# Patient Record
Sex: Male | Born: 1950 | Race: White | Hispanic: No | Marital: Married | Smoking: Former smoker
Health system: Southern US, Community
[De-identification: ages and names within clinical notes are randomized; demographics above are authoritative.]

## PROBLEM LIST (undated history)

## (undated) DIAGNOSIS — I10 Essential (primary) hypertension: Secondary | ICD-10-CM

## (undated) DIAGNOSIS — E079 Disorder of thyroid, unspecified: Secondary | ICD-10-CM

## (undated) DIAGNOSIS — M1990 Unspecified osteoarthritis, unspecified site: Secondary | ICD-10-CM

## (undated) DIAGNOSIS — E039 Hypothyroidism, unspecified: Secondary | ICD-10-CM

## (undated) DIAGNOSIS — N183 Chronic kidney disease, stage 3 unspecified: Secondary | ICD-10-CM

## (undated) DIAGNOSIS — M109 Gout, unspecified: Secondary | ICD-10-CM

## (undated) DIAGNOSIS — K5190 Ulcerative colitis, unspecified, without complications: Secondary | ICD-10-CM

## (undated) HISTORY — PX: FRACTURE SURGERY: SHX138

## (undated) HISTORY — PX: KNEE CARTILAGE SURGERY: SHX688

## (undated) HISTORY — PX: TIBIA HARDWARE REMOVAL: SUR1133

---

## 1986-04-10 HISTORY — PX: TIBIA FRACTURE SURGERY: SHX806

## 2003-04-11 HISTORY — PX: COLOSTOMY REVERSAL: SHX5782

## 2003-04-11 HISTORY — PX: COLECTOMY: SHX59

## 2006-08-12 ENCOUNTER — Other Ambulatory Visit: Payer: Self-pay

## 2006-08-12 ENCOUNTER — Inpatient Hospital Stay: Payer: Self-pay | Admitting: Internal Medicine

## 2010-03-24 ENCOUNTER — Inpatient Hospital Stay: Payer: Self-pay | Admitting: General Surgery

## 2012-06-05 ENCOUNTER — Emergency Department: Payer: Self-pay | Admitting: Unknown Physician Specialty

## 2012-06-05 LAB — URINALYSIS, COMPLETE
Blood: NEGATIVE
Glucose,UR: NEGATIVE mg/dL (ref 0–75)
Hyaline Cast: 1
Leukocyte Esterase: NEGATIVE
Nitrite: NEGATIVE
Ph: 5 (ref 4.5–8.0)
Protein: NEGATIVE
Specific Gravity: 1.027 (ref 1.003–1.030)
WBC UR: 1 /HPF (ref 0–5)

## 2012-06-05 LAB — TSH: Thyroid Stimulating Horm: 3.43 u[IU]/mL

## 2012-06-05 LAB — CBC
HGB: 13 g/dL (ref 13.0–18.0)
RBC: 4.1 10*6/uL — ABNORMAL LOW (ref 4.40–5.90)
RDW: 13.6 % (ref 11.5–14.5)
WBC: 10.6 10*3/uL (ref 3.8–10.6)

## 2012-06-05 LAB — COMPREHENSIVE METABOLIC PANEL
Albumin: 3.2 g/dL — ABNORMAL LOW (ref 3.4–5.0)
Alkaline Phosphatase: 90 U/L (ref 50–136)
BUN: 18 mg/dL (ref 7–18)
Bilirubin,Total: 0.6 mg/dL (ref 0.2–1.0)
Chloride: 104 mmol/L (ref 98–107)
Co2: 25 mmol/L (ref 21–32)
Creatinine: 1.16 mg/dL (ref 0.60–1.30)
EGFR (Non-African Amer.): >60
Glucose: 116 mg/dL — ABNORMAL HIGH (ref 65–99)
Potassium: 4.1 mmol/L (ref 3.5–5.1)
SGOT(AST): 25 U/L (ref 15–37)
SGPT (ALT): 23 U/L (ref 12–78)
Sodium: 135 mmol/L — ABNORMAL LOW (ref 136–145)
Total Protein: 7.9 g/dL (ref 6.4–8.2)

## 2012-06-05 LAB — CK TOTAL AND CKMB (NOT AT ARMC): CK, Total: 324 U/L — ABNORMAL HIGH (ref 35–232)

## 2012-06-05 LAB — TROPONIN I: Troponin-I: <0.02 ng/mL

## 2013-08-12 ENCOUNTER — Ambulatory Visit: Payer: Self-pay | Admitting: Emergency Medicine

## 2013-08-12 LAB — DOT URINE DIP
Glucose,UR: NEGATIVE mg/dL (ref 0–75)
PROTEIN: NEGATIVE
Specific Gravity: 1.02 (ref 1.003–1.030)

## 2013-11-28 DIAGNOSIS — I10 Essential (primary) hypertension: Secondary | ICD-10-CM | POA: Diagnosis not present

## 2015-10-31 ENCOUNTER — Encounter: Payer: Self-pay | Admitting: Emergency Medicine

## 2015-10-31 ENCOUNTER — Emergency Department
Admission: EM | Admit: 2015-10-31 | Discharge: 2015-10-31 | Disposition: A | Discharge status: Home / Self Care | Payer: Medicare Other | Attending: Emergency Medicine | Admitting: Emergency Medicine

## 2015-10-31 DIAGNOSIS — I10 Essential (primary) hypertension: Secondary | ICD-10-CM | POA: Diagnosis not present

## 2015-10-31 DIAGNOSIS — M109 Gout, unspecified: Secondary | ICD-10-CM | POA: Insufficient documentation

## 2015-10-31 DIAGNOSIS — Z87891 Personal history of nicotine dependence: Secondary | ICD-10-CM | POA: Diagnosis not present

## 2015-10-31 DIAGNOSIS — M7989 Other specified soft tissue disorders: Secondary | ICD-10-CM | POA: Diagnosis present

## 2015-10-31 HISTORY — DX: Essential (primary) hypertension: I10

## 2015-10-31 HISTORY — DX: Gout, unspecified: M10.9

## 2015-10-31 HISTORY — DX: Disorder of thyroid, unspecified: E07.9

## 2015-10-31 MED ORDER — PREDNISONE 20 MG PO TABS
ORAL_TABLET | ORAL | Status: AC
  Administered 2015-10-31: 60 mg via ORAL
  Filled 2015-10-31: qty 3

## 2015-10-31 MED ORDER — COLCHICINE 0.6 MG PO TABS: 0.6000 mg | ORAL_TABLET | ORAL | 0 refills | Status: AC | PRN

## 2015-10-31 MED ORDER — ALLOPURINOL 300 MG PO TABS: 300.0000 mg | ORAL_TABLET | Freq: Every day | ORAL | 0 refills | Status: DC

## 2015-10-31 MED ORDER — COLCHICINE 0.6 MG PO TABS
ORAL_TABLET | ORAL | Status: AC
  Administered 2015-10-31: 1.2 mg via ORAL
  Filled 2015-10-31: qty 2

## 2015-10-31 MED ORDER — COLCHICINE 0.6 MG PO TABS
1.2000 mg | ORAL_TABLET | Freq: Once | ORAL | Status: AC
  Administered 2015-10-31: 1.2 mg via ORAL

## 2015-10-31 MED ORDER — KETOROLAC TROMETHAMINE 10 MG PO TABS
ORAL_TABLET | ORAL | Status: AC
  Administered 2015-10-31: 10 mg via ORAL
  Filled 2015-10-31: qty 1

## 2015-10-31 MED ORDER — PREDNISONE 20 MG PO TABS
60.0000 mg | ORAL_TABLET | Freq: Once | ORAL | Status: AC
  Administered 2015-10-31: 60 mg via ORAL

## 2015-10-31 MED ORDER — KETOROLAC TROMETHAMINE 10 MG PO TABS
10.0000 mg | ORAL_TABLET | Freq: Once | ORAL | Status: AC
  Administered 2015-10-31: 10 mg via ORAL

## 2015-10-31 NOTE — Discharge Instructions (Signed)

## 2015-10-31 NOTE — ED Notes (Signed)
See triage assessment.  Pt alert and oriented in NAD.

## 2015-10-31 NOTE — ED Provider Notes (Signed)
[facility] Emergency Department Provider Note  ____________________________________________  Time seen: Approximately 7:18 AM  I have reviewed the triage vital signs and the nursing notes.   HISTORY  Chief Complaint Gout    HPI Jeremy Atkinson is a 65 y.o. male , NAD, presents to the emergency department with acute one set gout flare. States he had pain, redness, swelling started in the right great toe overnight last night. Has been on allopurinol 300 mg once daily for some time but ran out 3 days ago. Also ran out of his culture seen in which she normally takes for acute flares. Patient is currently changing family physicians from the [facility] [place] [facility] but does not have an appointment to establish until August. Denies any injury, trauma or falls calls the pain in his toe. Has not had any chest pain, shortness of breath, redness or warmth to the lower extremity. Has been able to ambulate but states increased pain in the right great toe. No open wounds or lesions and has not noted any oozing or weeping.   Past Medical History:  Diagnosis Date  . Gout   . Hypertension   . Thyroid disease     There are no active problems to display for this patient.   Past Surgical History:  Procedure Laterality Date  . COLON SURGERY    . COLOSTOMY REVERSAL    . KNEE SURGERY        Allergies Percocet [oxycodone-acetaminophen]  No family history on file.  Social History Social History  Substance Use Topics  . Smoking status: Former Research scientist (life sciences)  . Smokeless tobacco: Never Used  . Alcohol use Not on file     Review of Systems  Constitutional: No fever/chills, fatigue Eyes: No visual changes. Cardiovascular: No chest pain. Respiratory:  No shortness of breath.  Gastrointestinal: No abdominal pain.  No nausea, vomiting.   Musculoskeletal: Positive right great toe pain. No foot, ankle, knee pain. Skin: Positive redness and swelling about the distal  joint of right great toe. Negative for rash, skin sores, open wounds, bruising. Neurological: Negative for headaches, focal weakness or numbness. No tingling 10-point ROS otherwise negative.  ____________________________________________   PHYSICAL EXAM:  VITAL SIGNS: ED Triage Vitals  Enc Vitals Group     BP 10/31/15 0507 118/72     Pulse Rate 10/31/15 0507 73     Resp 10/31/15 0507 18     Temp 10/31/15 0507 98.2 F (36.8 C)     Temp Source 10/31/15 0507 Oral     SpO2 10/31/15 0507 93 %     Weight 10/31/15 0507 200 lb (90.7 kg)     Height 10/31/15 0507 6' (1.829 m)     Head Circumference --      Peak Flow --      Pain Score 10/31/15 0508 8     Pain Loc --      Pain Edu? --      Excl. in [place]? --      Constitutional: Alert and oriented. Well appearing and in no acute distress. Eyes: Conjunctivae are normal. Head: Atraumatic. Neck: Supple with full range of motion Hematological/Lymphatic/Immunilogical: No cervical lymphadenopathy. Cardiovascular: Normal rate, regular rhythm. Normal S1 and S2.  Good peripheral circulation with 2+ pulses noted in the right lower extremity. Capillary refill is brisk in all digits of the right foot. Respiratory: Normal respiratory effort without tachypnea or retractions. Lungs CTAB with breath sounds noted in all lung fields. No wheeze, rhonchi, rales.  Musculoskeletal: Full range of motion of the right ankle, foot, toes. Tenderness to palpation and manipulation of the right first DIP. No crepitus or bony abnormality noted to palpation. No lower extremity tenderness nor edema.  No joint effusions. Neurologic:  Normal speech and language. No gross focal neurologic deficits are appreciated.  Skin:  Skin about the right first DIP of the foot is diffusely erythematous, swollen and warm. No open wounds or lesions. No oozing or weeping. Skin is warm, dry and intact. No rash noted. Psychiatric: Mood and affect are normal. Speech and behavior are normal.  Patient exhibits appropriate insight and judgement.   ____________________________________________   LABS  None ____________________________________________  EKG  None ____________________________________________  RADIOLOGY  None ____________________________________________    PROCEDURES  Procedure(s) performed: None    Medications  colchicine tablet 1.2 mg (1.2 mg Oral Given 10/31/15 0516)  predniSONE (DELTASONE) tablet 60 mg (60 mg Oral Given 10/31/15 0516)  ketorolac (TORADOL) tablet 10 mg (10 mg Oral Given 10/31/15 0516)    Patient notes improvement of pain and swelling about the right great toe since being given medications per above. ____________________________________________   INITIAL IMPRESSION / ASSESSMENT AND PLAN / ED COURSE  Pertinent labs & imaging results that were available during my care of the patient were reviewed by me and considered in my medical decision making (see chart for details).  Patient's diagnosis is consistent with acute gout of the right foot. Patient will be discharged home with prescriptions for allopurinol and colchicine to take as directed. Patient is to follow up with [facility] if symptoms persist past this treatment course. Patient is given ED precautions to return to the ED for any worsening or new symptoms.     ____________________________________________  FINAL CLINICAL IMPRESSION(S) / ED DIAGNOSES  Final diagnoses:  Acute gout of right foot, unspecified cause      NEW MEDICATIONS STARTED DURING THIS VISIT:  Discharge Medication List as of 10/31/2015  7:38 AM    START taking these medications   Details  allopurinol (ZYLOPRIM) 300 MG tablet Take 1 tablet (300 mg total) by mouth daily., Starting Sun 10/31/2015, Until Wed 12/01/2015, Print    colchicine 0.6 MG tablet Take 1 tablet (0.6 mg total) by mouth as needed. Take 2 tablets at onset of pain, then may take 1 more 1 hour later if pain continues.,  Starting Sun 10/31/2015, Print             Braxton Feathers, PA-C 10/31/15 [place], MD 10/31/15 [phone]

## 2015-10-31 NOTE — ED Triage Notes (Signed)
Patient states that he has a history of gout and that he started having pain to right first toe that developed last night. Patient with redness and swelling to his toe. Patient reports that he is switching mds and his appointment is not until august so he is unable to get a prescription.

## 2015-10-31 NOTE — ED Notes (Signed)
This RN to speak with provider while patient still being triaged. Jeremy Client, MD consulted. MD made aware of presenting complaints and triage assessment. MD with [facility] for: Prednisone 60mg  PO, Colcrys 1.2mg  PO, and Toradol 10mg  PO. Orders to be entered and carried by this RN.

## 2016-06-02 ENCOUNTER — Emergency Department: Payer: Medicare Other

## 2016-06-02 ENCOUNTER — Encounter (HOSPITAL_COMMUNITY): Payer: Self-pay | Admitting: General Practice

## 2016-06-02 ENCOUNTER — Observation Stay (HOSPITAL_COMMUNITY)
Admission: AD | Admit: 2016-06-02 | Discharge: 2016-06-03 | Disposition: A | Discharge status: Home / Self Care | Payer: No Typology Code available for payment source | Source: Other Acute Inpatient Hospital | Attending: General Surgery | Admitting: General Surgery

## 2016-06-02 ENCOUNTER — Emergency Department
Admission: EM | Admit: 2016-06-02 | Discharge: 2016-06-02 | Disposition: A | Discharge status: Other Acute Inpatient Hospital | Payer: Medicare Other | Attending: Emergency Medicine | Admitting: Emergency Medicine

## 2016-06-02 DIAGNOSIS — Y999 Unspecified external cause status: Secondary | ICD-10-CM | POA: Insufficient documentation

## 2016-06-02 DIAGNOSIS — I10 Essential (primary) hypertension: Secondary | ICD-10-CM | POA: Diagnosis not present

## 2016-06-02 DIAGNOSIS — S2239XA Fracture of one rib, unspecified side, initial encounter for closed fracture: Secondary | ICD-10-CM

## 2016-06-02 DIAGNOSIS — S2242XA Multiple fractures of ribs, left side, initial encounter for closed fracture: Secondary | ICD-10-CM | POA: Insufficient documentation

## 2016-06-02 DIAGNOSIS — Y9389 Activity, other specified: Secondary | ICD-10-CM | POA: Insufficient documentation

## 2016-06-02 DIAGNOSIS — S270XXA Traumatic pneumothorax, initial encounter: Secondary | ICD-10-CM

## 2016-06-02 DIAGNOSIS — S51012A Laceration without foreign body of left elbow, initial encounter: Secondary | ICD-10-CM | POA: Diagnosis not present

## 2016-06-02 DIAGNOSIS — Z9049 Acquired absence of other specified parts of digestive tract: Secondary | ICD-10-CM | POA: Diagnosis not present

## 2016-06-02 DIAGNOSIS — Y92410 Unspecified street and highway as the place of occurrence of the external cause: Secondary | ICD-10-CM | POA: Diagnosis not present

## 2016-06-02 DIAGNOSIS — M109 Gout, unspecified: Secondary | ICD-10-CM | POA: Diagnosis not present

## 2016-06-02 DIAGNOSIS — K5190 Ulcerative colitis, unspecified, without complications: Secondary | ICD-10-CM | POA: Insufficient documentation

## 2016-06-02 DIAGNOSIS — E039 Hypothyroidism, unspecified: Secondary | ICD-10-CM | POA: Insufficient documentation

## 2016-06-02 DIAGNOSIS — Z885 Allergy status to narcotic agent status: Secondary | ICD-10-CM | POA: Insufficient documentation

## 2016-06-02 DIAGNOSIS — Z79899 Other long term (current) drug therapy: Secondary | ICD-10-CM | POA: Diagnosis not present

## 2016-06-02 DIAGNOSIS — Z87891 Personal history of nicotine dependence: Secondary | ICD-10-CM | POA: Insufficient documentation

## 2016-06-02 DIAGNOSIS — S0990XA Unspecified injury of head, initial encounter: Secondary | ICD-10-CM | POA: Diagnosis present

## 2016-06-02 DIAGNOSIS — S2249XA Multiple fractures of ribs, unspecified side, initial encounter for closed fracture: Secondary | ICD-10-CM

## 2016-06-02 HISTORY — DX: Hypothyroidism, unspecified: E03.9

## 2016-06-02 HISTORY — DX: Unspecified osteoarthritis, unspecified site: M19.90

## 2016-06-02 HISTORY — DX: Ulcerative colitis, unspecified, without complications: K51.90

## 2016-06-02 MED ORDER — ACETAMINOPHEN 325 MG PO TABS: 650.0000 mg | ORAL_TABLET | ORAL | Status: DC | PRN

## 2016-06-02 MED ORDER — TRAMADOL HCL 50 MG PO TABS
50.0000 mg | ORAL_TABLET | Freq: Four times a day (QID) | ORAL | Status: DC | PRN
  Administered 2016-06-02 – 2016-06-03 (×2): 50 mg via ORAL
  Filled 2016-06-02 (×2): qty 1

## 2016-06-02 MED ORDER — LOPERAMIDE HCL 2 MG PO CAPS: 2.0000 mg | ORAL_CAPSULE | Freq: Three times a day (TID) | ORAL | Status: DC | PRN

## 2016-06-02 MED ORDER — MORPHINE SULFATE (PF) 4 MG/ML IV SOLN
4.0000 mg | Freq: Once | INTRAVENOUS | Status: AC
  Administered 2016-06-02: 4 mg via INTRAVENOUS
  Filled 2016-06-02: qty 1

## 2016-06-02 MED ORDER — SODIUM CHLORIDE 0.9% FLUSH: 3.0000 mL | INTRAVENOUS | Status: DC | PRN

## 2016-06-02 MED ORDER — IBUPROFEN 200 MG PO TABS
200.0000 mg | ORAL_TABLET | ORAL | Status: DC | PRN
  Administered 2016-06-02 – 2016-06-03 (×2): 200 mg via ORAL
  Filled 2016-06-02 (×2): qty 1

## 2016-06-02 MED ORDER — ONDANSETRON HCL 4 MG/2ML IJ SOLN
4.0000 mg | Freq: Once | INTRAMUSCULAR | Status: AC
  Administered 2016-06-02: 4 mg via INTRAVENOUS
  Filled 2016-06-02: qty 2

## 2016-06-02 MED ORDER — HYDROMORPHONE HCL 2 MG/ML IJ SOLN
0.5000 mg | INTRAMUSCULAR | Status: DC | PRN
  Administered 2016-06-02 – 2016-06-03 (×2): 0.5 mg via INTRAVENOUS
  Filled 2016-06-02 (×2): qty 1

## 2016-06-02 MED ORDER — HYDRALAZINE HCL 20 MG/ML IJ SOLN
5.0000 mg | Freq: Four times a day (QID) | INTRAMUSCULAR | Status: DC | PRN
Start: 2016-06-02 — End: 2016-06-03

## 2016-06-02 MED ORDER — SODIUM CHLORIDE 0.9% FLUSH
3.0000 mL | Freq: Two times a day (BID) | INTRAVENOUS | Status: DC
  Administered 2016-06-02 (×2): 3 mL via INTRAVENOUS

## 2016-06-02 MED ORDER — ENOXAPARIN SODIUM 40 MG/0.4ML ~~LOC~~ SOLN
40.0000 mg | SUBCUTANEOUS | Status: DC
  Administered 2016-06-02: 40 mg via SUBCUTANEOUS
  Filled 2016-06-02: qty 0.4

## 2016-06-02 MED ORDER — ONDANSETRON HCL 4 MG/2ML IJ SOLN: 4.0000 mg | Freq: Four times a day (QID) | INTRAMUSCULAR | Status: DC | PRN

## 2016-06-02 MED ORDER — HYDROMORPHONE HCL 1 MG/ML IJ SOLN
1.0000 mg | Freq: Once | INTRAMUSCULAR | Status: AC
  Administered 2016-06-02: 1 mg via INTRAVENOUS
  Filled 2016-06-02: qty 1

## 2016-06-02 MED ORDER — SODIUM CHLORIDE 0.9 % IV SOLN: 250.0000 mL | INTRAVENOUS | Status: DC | PRN

## 2016-06-02 MED ORDER — ACETAMINOPHEN 325 MG PO TABS
650.0000 mg | ORAL_TABLET | Freq: Once | ORAL | Status: AC
  Administered 2016-06-02: 650 mg via ORAL
  Filled 2016-06-02: qty 2

## 2016-06-02 MED ORDER — ACETAMINOPHEN 325 MG PO TABS
650.0000 mg | ORAL_TABLET | Freq: Four times a day (QID) | ORAL | Status: DC | PRN
  Administered 2016-06-02: 650 mg via ORAL
  Filled 2016-06-02: qty 2

## 2016-06-02 MED ORDER — BACITRACIN ZINC 500 UNIT/GM EX OINT
1.0000 "application " | TOPICAL_OINTMENT | Freq: Two times a day (BID) | CUTANEOUS | Status: DC
  Administered 2016-06-02: 1 via TOPICAL
  Filled 2016-06-02: qty 0.9

## 2016-06-02 MED ORDER — BUTALBITAL-APAP-CAFFEINE 50-325-40 MG PO TABS
2.0000 | ORAL_TABLET | Freq: Once | ORAL | Status: AC
  Administered 2016-06-02: 2 via ORAL
  Filled 2016-06-02: qty 2

## 2016-06-02 MED ORDER — ONDANSETRON HCL 4 MG PO TABS
4.0000 mg | ORAL_TABLET | Freq: Four times a day (QID) | ORAL | Status: DC | PRN
Start: 2016-06-02 — End: 2016-06-03
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
## 2016-06-02 NOTE — ED Provider Notes (Signed)
ED ECG REPORT I, Daymon Larsen, the attending physician, personally viewed and interpreted this ECG.  Date: 06/02/2016 EKG Time: *0931 Rate: *86 Rhythm: normal sinus rhythm QRS Axis: normal Intervals: normal ST/T Wave abnormalities: normal Conduction Disturbances: none Narrative Interpretation: unremarkable Normal EKG   Daymon Larsen, MD 06/02/16 1209

## 2016-06-02 NOTE — ED Notes (Addendum)
[facility] has pt on stretcher and is leaving ED at this time. Report called to Patty, RN at this time.

## 2016-06-02 NOTE — Progress Notes (Signed)
Pt arrived to 6n3 by [facility] from [facility]. Oriented to room and surroundings, denies nausea, c/o left rib pain 5/10. Trauma notified of arrrival
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
## 2016-06-02 NOTE — Discharge Instructions (Signed)
Rib Fracture A rib fracture is a break or crack in one of the bones of the ribs. The ribs are like a cage that goes around your upper chest. A broken or cracked rib is often painful, but most do not cause other problems. Most rib fractures heal on their own in 1-3 months. Follow these instructions at home:  Avoid activities that cause pain to the injured area. Protect your injured area.  Slowly increase activity as told by your doctor.  Take medicine as told by your doctor.  Put ice on the injured area for the first 1-2 days after you have been treated or as told by your doctor.  Put ice in a plastic bag.  Place a towel between your skin and the bag.  Leave the ice on for 15-20 minutes at a time, every 2 hours while you are awake.  Do deep breathing as told by your doctor. You may be told to:  Take deep breaths many times a day.  Cough many times a day while hugging a pillow.  Use a device (incentive spirometer) to perform deep breathing many times a day.  Drink enough fluids to keep your pee (urine) clear or pale yellow.  Do not wear a rib belt or binder. These do not allow you to breathe deeply. Get help right away if:  You have a fever.  You have trouble breathing.  You cannot stop coughing.  You cough up thick or bloody spit (mucus).  You feel sick to your stomach (nauseous), throw up (vomit), or have belly (abdominal) pain.  Your pain gets worse and medicine does not help. This information is not intended to replace advice given to you by your health care provider. Make sure you discuss any questions you have with your health care provider. Document Released: 01/04/2008 Document Revised: 09/02/2015 Document Reviewed: 05/29/2012 Elsevier Interactive Patient Education  2017 [employer]  

## 2016-06-03 ENCOUNTER — Observation Stay (HOSPITAL_COMMUNITY): Payer: No Typology Code available for payment source

## 2016-06-03 DIAGNOSIS — S2242XA Multiple fractures of ribs, left side, initial encounter for closed fracture: Secondary | ICD-10-CM | POA: Diagnosis not present

## 2016-06-03 LAB — BASIC METABOLIC PANEL
ANION GAP: 6 (ref 5–15)
BUN: 22 mg/dL — AB (ref 6–20)
CALCIUM: 8.8 mg/dL — AB (ref 8.9–10.3)
CO2: 27 mmol/L (ref 22–32)
CREATININE: 1.36 mg/dL — AB (ref 0.61–1.24)
Chloride: 104 mmol/L (ref 101–111)
GFR calc Af Amer: >60 mL/min (ref >60)
GFR, EST NON AFRICAN AMERICAN: 52 mL/min — AB (ref >60)
GLUCOSE: 105 mg/dL — AB (ref 65–99)
Potassium: 4.2 mmol/L (ref 3.5–5.1)
Sodium: 137 mmol/L (ref 135–145)

## 2016-06-03 LAB — CBC
HCT: 36.4 % — ABNORMAL LOW (ref 39.0–52.0)
Hemoglobin: 12.2 g/dL — ABNORMAL LOW (ref 13.0–17.0)
MCH: 32.4 pg (ref 26.0–34.0)
MCHC: 33.5 g/dL (ref 30.0–36.0)
MCV: 96.6 fL (ref 78.0–100.0)
PLATELETS: 125 10*3/uL — AB (ref 150–400)
RBC: 3.77 MIL/uL — ABNORMAL LOW (ref 4.22–5.81)
RDW: 13.4 % (ref 11.5–15.5)
WBC: 4.8 10*3/uL (ref 4.0–10.5)

## 2016-06-03 MED ORDER — TRAMADOL HCL 50 MG PO TABS: 50.0000 mg | ORAL_TABLET | Freq: Four times a day (QID) | ORAL | 0 refills | Status: DC | PRN
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
## 2016-06-03 NOTE — Progress Notes (Signed)
Pt verbally understood DC instructions, no questions asked 

## 2016-06-03 NOTE — Progress Notes (Signed)
Patient doing okay.  CXR clear.  Can go home.  Kathryne Eriksson. Dahlia Bailiff, MD, [place] [phone] Trauma Surgeon
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
## 2017-06-06 ENCOUNTER — Other Ambulatory Visit: Payer: Self-pay | Admitting: Internal Medicine

## 2017-06-06 DIAGNOSIS — N183 Chronic kidney disease, stage 3 unspecified: Secondary | ICD-10-CM

## 2017-06-13 ENCOUNTER — Ambulatory Visit
Admission: RE | Admit: 2017-06-13 | Discharge: 2017-06-13 | Disposition: A | Discharge status: Home / Self Care | Payer: Medicare Other | Source: Ambulatory Visit | Attending: Internal Medicine | Admitting: Internal Medicine

## 2017-06-13 DIAGNOSIS — N183 Chronic kidney disease, stage 3 unspecified: Secondary | ICD-10-CM

## 2017-06-13 DIAGNOSIS — E039 Hypothyroidism, unspecified: Secondary | ICD-10-CM | POA: Insufficient documentation
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
## 2018-06-13 IMAGING — US US RENAL
1 series · 14 of 25 positions shown · non-contrast
Comparison: CT abdomen pelvis of 03/23/2010

CLINICAL DATA: Chronic kidney disease, stage III

EXAM:
RENAL / URINARY TRACT ULTRASOUND COMPLETE

[Series 1: us renal · 0.26mm/px · 14 of 33 slices shown]
[im 1/33]
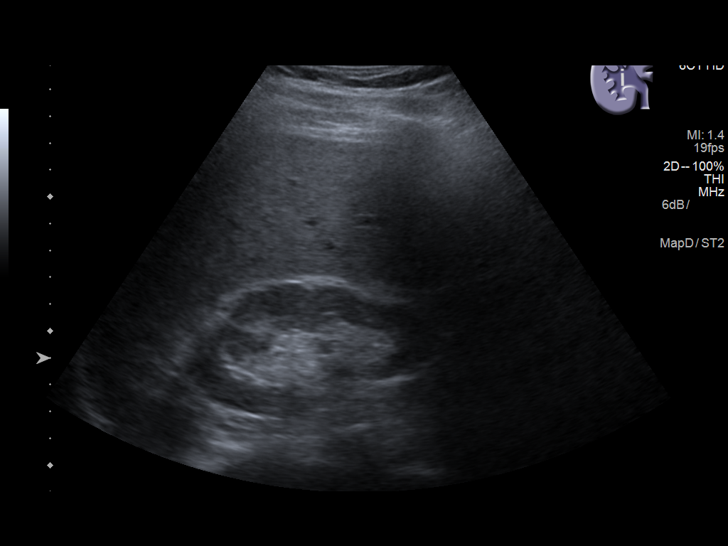
[im 3/33]
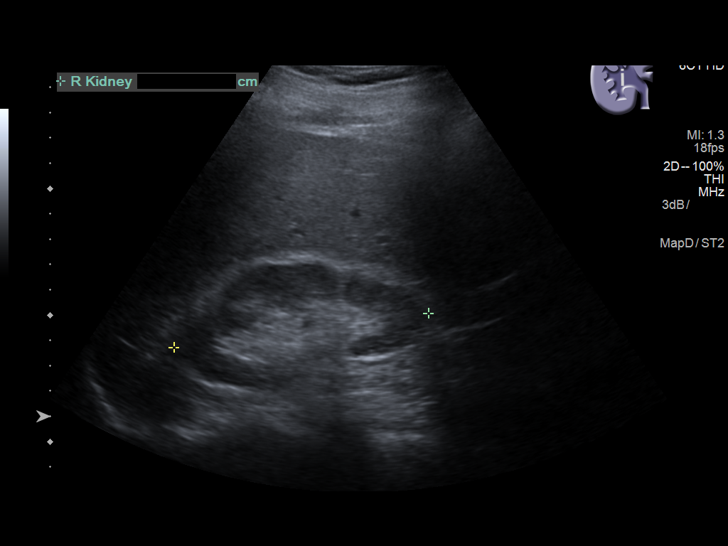
[im 6/33]
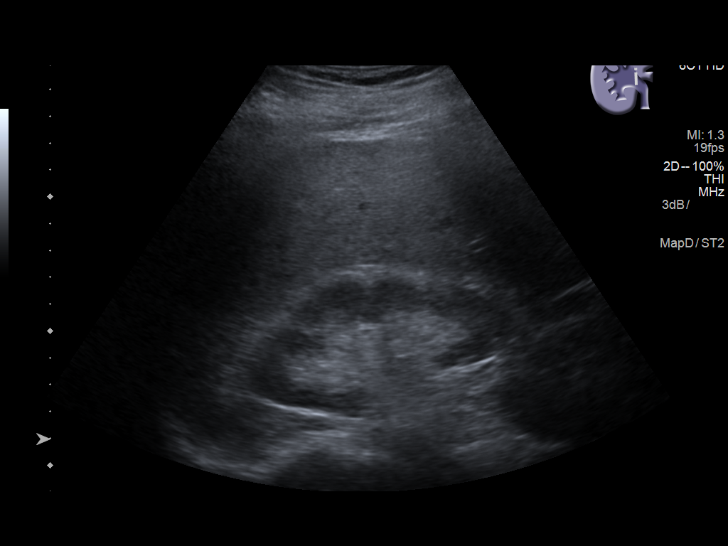
[im 9/33]
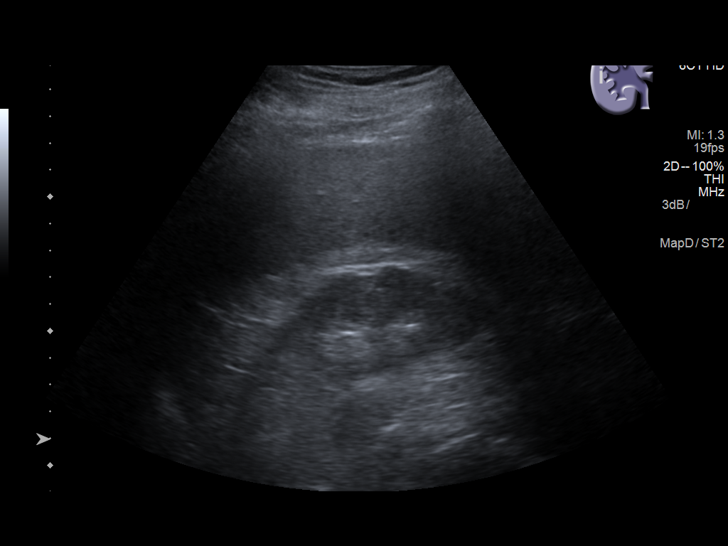
[im 11/33]
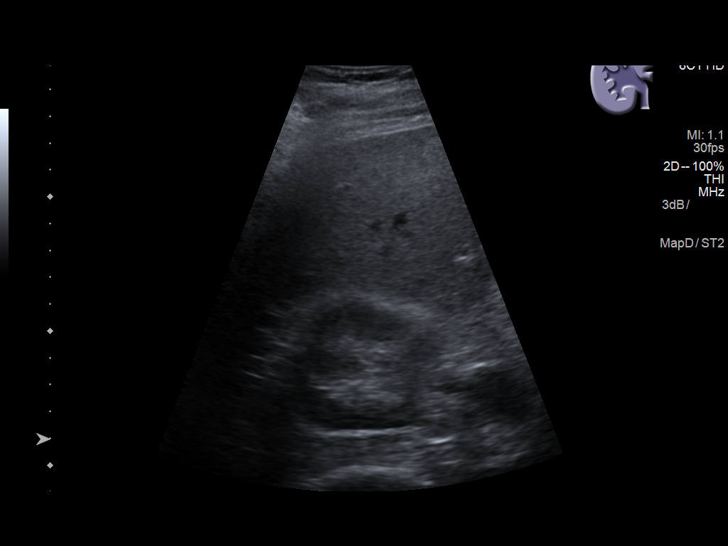
[im 13/33]
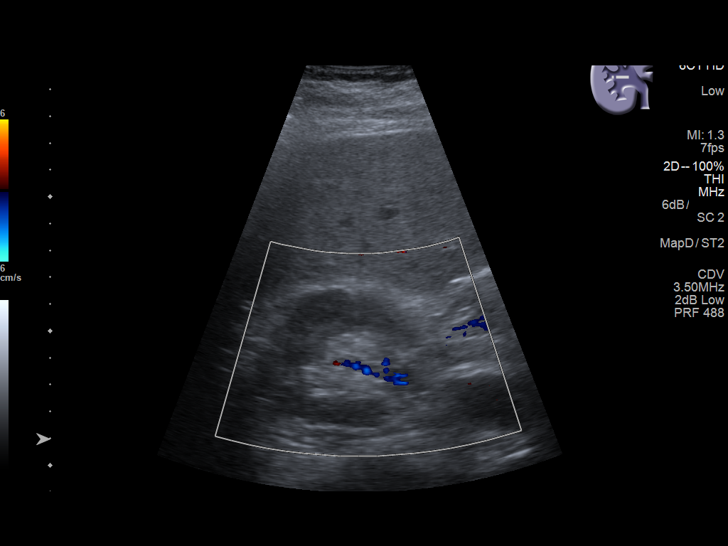
[im 15/33]
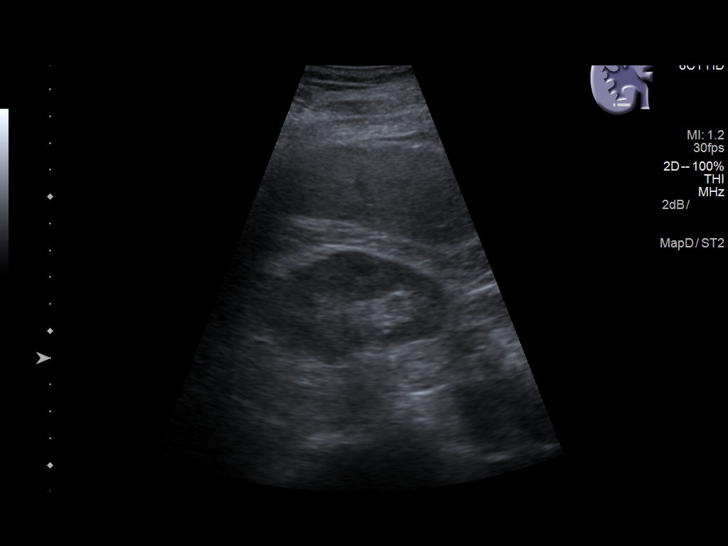
[im 18/33]
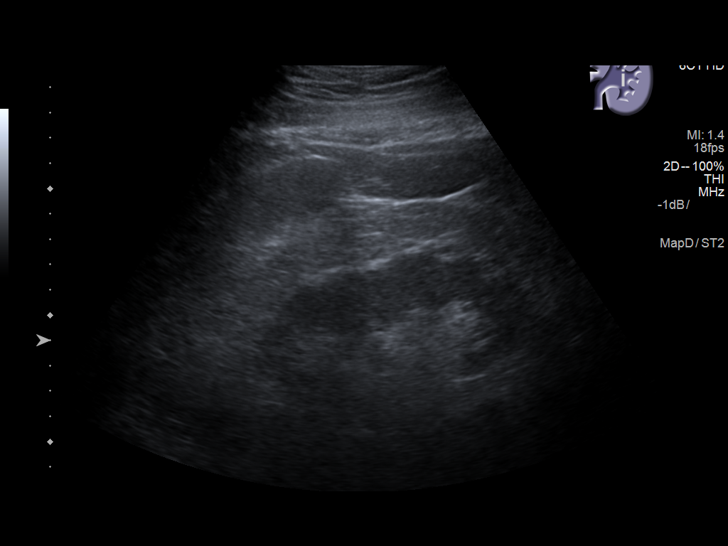
[im 21/33]
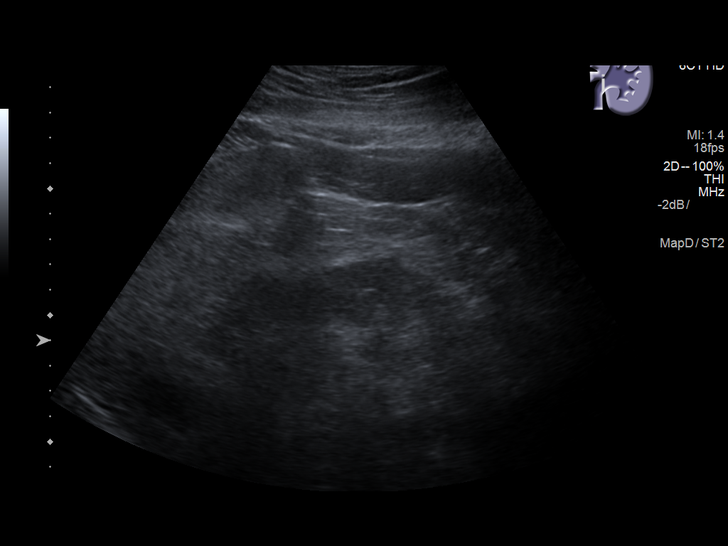
[im 22/33]
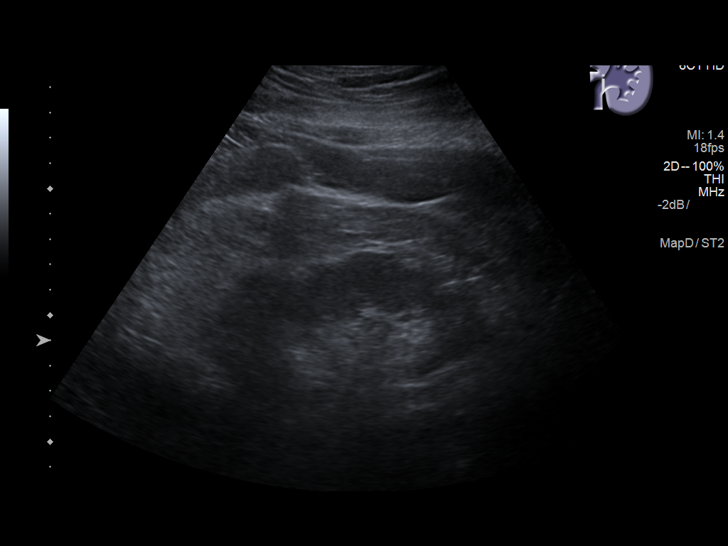
[im 25/33]
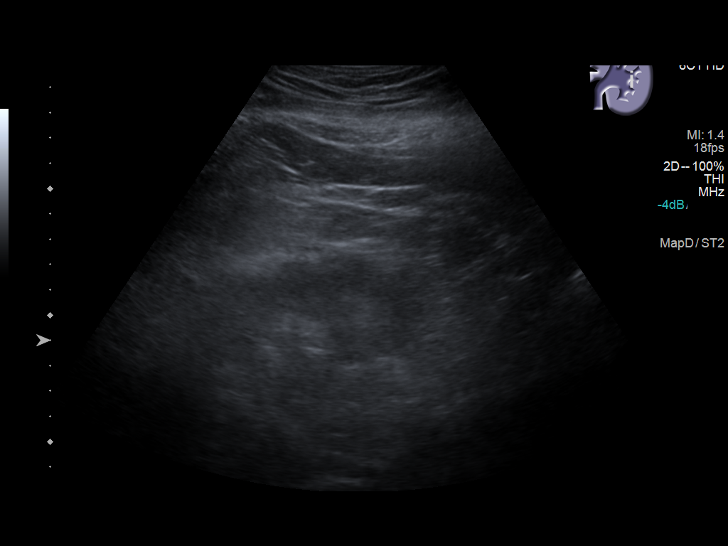
[im 27/33]
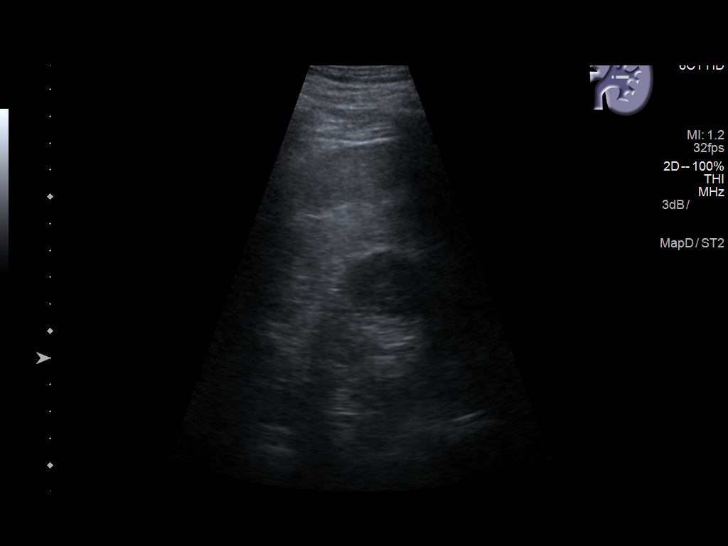
[im 30/33]
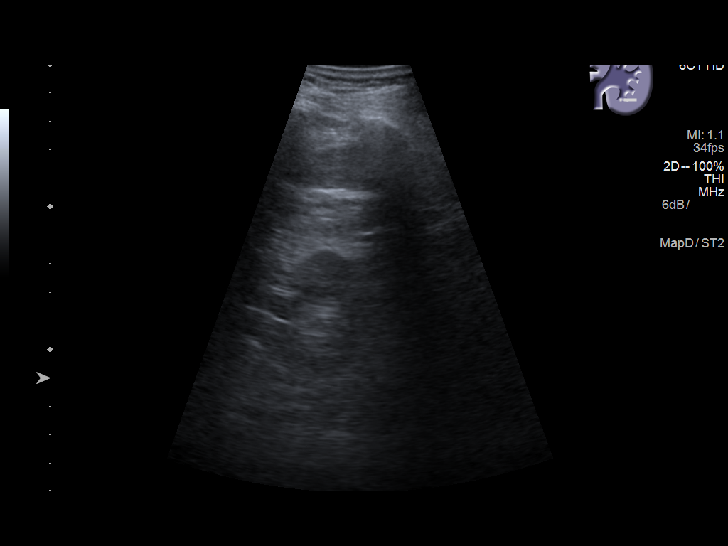
[im 33/33]
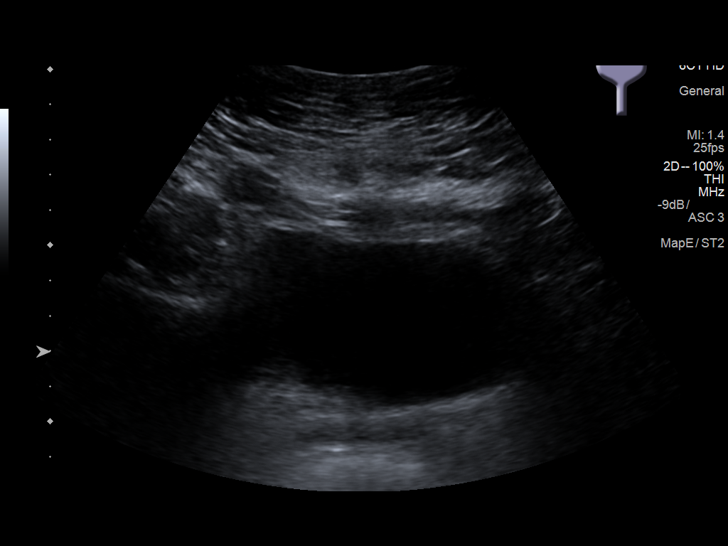

[14 of 25 positions shown; findings below may reference images not displayed]

FINDINGS: Right Kidney:

Length: 10.1 cm.. No hydronephrosis is seen. The echogenicity of the
renal parenchyma is within normal limits.

Left Kidney:

Length: 10.5 cm.. No hydronephrosis is noted. The renal parenchymal
echogenicity is within normal limits.

Bladder:

Urinary bladder is unremarkable.
IMPRESSION: No hydronephrosis. The renal parenchymal echogenicity is within
normal limits.

## 2019-06-02 ENCOUNTER — Other Ambulatory Visit: Payer: Self-pay

## 2019-06-02 ENCOUNTER — Encounter: Payer: Self-pay | Admitting: *Deleted

## 2019-06-03 ENCOUNTER — Other Ambulatory Visit
Admission: RE | Admit: 2019-06-03 | Discharge: 2019-06-03 | Disposition: A | Discharge status: Home / Self Care | Payer: Medicare PPO | Source: Ambulatory Visit | Attending: Ophthalmology | Admitting: Ophthalmology

## 2019-06-03 DIAGNOSIS — Z01812 Encounter for preprocedural laboratory examination: Secondary | ICD-10-CM | POA: Diagnosis present

## 2019-06-03 DIAGNOSIS — Z20822 Contact with and (suspected) exposure to covid-19: Secondary | ICD-10-CM | POA: Insufficient documentation

## 2019-06-03 LAB — SARS CORONAVIRUS 2 (TAT 6-24 HRS): SARS Coronavirus 2: NEGATIVE

## 2019-06-04 NOTE — Discharge Instructions (Signed)

## 2019-06-05 ENCOUNTER — Ambulatory Visit
Admission: RE | Admit: 2019-06-05 | Discharge: 2019-06-05 | Disposition: A | Discharge status: Home / Self Care | Payer: Medicare PPO | Attending: Ophthalmology | Admitting: Ophthalmology

## 2019-06-05 ENCOUNTER — Other Ambulatory Visit: Payer: Self-pay

## 2019-06-05 ENCOUNTER — Ambulatory Visit: Payer: Medicare PPO | Admitting: Anesthesiology

## 2019-06-05 ENCOUNTER — Encounter
Admission: RE | Disposition: A | Discharge status: Home / Self Care | Payer: Self-pay | Source: Home / Self Care | Attending: Ophthalmology

## 2019-06-05 ENCOUNTER — Encounter: Payer: Self-pay | Admitting: Ophthalmology

## 2019-06-05 DIAGNOSIS — E7800 Pure hypercholesterolemia, unspecified: Secondary | ICD-10-CM | POA: Diagnosis not present

## 2019-06-05 DIAGNOSIS — E039 Hypothyroidism, unspecified: Secondary | ICD-10-CM | POA: Insufficient documentation

## 2019-06-05 DIAGNOSIS — I10 Essential (primary) hypertension: Secondary | ICD-10-CM | POA: Insufficient documentation

## 2019-06-05 DIAGNOSIS — Z9049 Acquired absence of other specified parts of digestive tract: Secondary | ICD-10-CM | POA: Diagnosis not present

## 2019-06-05 DIAGNOSIS — K279 Peptic ulcer, site unspecified, unspecified as acute or chronic, without hemorrhage or perforation: Secondary | ICD-10-CM | POA: Insufficient documentation

## 2019-06-05 DIAGNOSIS — Z87891 Personal history of nicotine dependence: Secondary | ICD-10-CM | POA: Diagnosis not present

## 2019-06-05 DIAGNOSIS — H2511 Age-related nuclear cataract, right eye: Secondary | ICD-10-CM | POA: Insufficient documentation

## 2019-06-05 HISTORY — DX: Chronic kidney disease, stage 3 unspecified: N18.30

## 2019-06-05 HISTORY — PX: CATARACT EXTRACTION W/PHACO: SHX586

## 2019-06-05 SURGERY — PHACOEMULSIFICATION, CATARACT, WITH IOL INSERTION
Anesthesia: Monitor Anesthesia Care | Site: Eye | Laterality: Right

## 2019-06-05 MED ORDER — FENTANYL CITRATE (PF) 100 MCG/2ML IJ SOLN
INTRAMUSCULAR | Status: DC | PRN
  Administered 2019-06-05 (×2): 25 ug via INTRAVENOUS
  Administered 2019-06-05: 50 ug via INTRAVENOUS

## 2019-06-05 MED ORDER — EPINEPHRINE PF 1 MG/ML IJ SOLN
INTRAOCULAR | Status: DC | PRN
  Administered 2019-06-05: 54 mL via OPHTHALMIC

## 2019-06-05 MED ORDER — LIDOCAINE HCL (PF) 2 % IJ SOLN
INTRAOCULAR | Status: DC | PRN
  Administered 2019-06-05: 2 mL via INTRAOCULAR

## 2019-06-05 MED ORDER — MOXIFLOXACIN HCL 0.5 % OP SOLN
OPHTHALMIC | Status: DC | PRN
  Administered 2019-06-05: 0.2 mL via OPHTHALMIC

## 2019-06-05 MED ORDER — PROVISC 10 MG/ML IO SOLN
INTRAOCULAR | Status: DC | PRN
  Administered 2019-06-05: 0.55 mL via INTRAOCULAR

## 2019-06-05 MED ORDER — TETRACAINE HCL 0.5 % OP SOLN
1.0000 [drp] | OPHTHALMIC | Status: DC | PRN
  Administered 2019-06-05 (×3): 1 [drp] via OPHTHALMIC

## 2019-06-05 MED ORDER — ACETAMINOPHEN 160 MG/5ML PO SOLN: 325.0000 mg | Freq: Once | ORAL | Status: DC

## 2019-06-05 MED ORDER — TRYPAN BLUE 0.06 % OP SOLN
OPHTHALMIC | Status: DC | PRN
  Administered 2019-06-05: 0.5 mL via INTRAOCULAR

## 2019-06-05 MED ORDER — MIDAZOLAM HCL 2 MG/2ML IJ SOLN
INTRAMUSCULAR | Status: DC | PRN
  Administered 2019-06-05: 1 mg via INTRAVENOUS
  Administered 2019-06-05 (×2): .5 mg via INTRAVENOUS

## 2019-06-05 MED ORDER — ARMC OPHTHALMIC DILATING DROPS
1.0000 "application " | OPHTHALMIC | Status: DC | PRN
  Administered 2019-06-05 (×3): 1 via OPHTHALMIC

## 2019-06-05 MED ORDER — NA CHONDROIT SULF-NA HYALURON 40-17 MG/ML IO SOLN
INTRAOCULAR | Status: DC | PRN
  Administered 2019-06-05: 1 mL via INTRAOCULAR

## 2019-06-05 MED ORDER — BRIMONIDINE TARTRATE-TIMOLOL 0.2-0.5 % OP SOLN
OPHTHALMIC | Status: DC | PRN
  Administered 2019-06-05: 1 [drp] via OPHTHALMIC

## 2019-06-05 MED ORDER — ACETAMINOPHEN 325 MG PO TABS: 325.0000 mg | ORAL_TABLET | Freq: Once | ORAL | Status: DC

## 2019-06-05 SURGICAL SUPPLY — 16 items
DISSECTOR HYDRO NUCLEUS 50X22 (MISCELLANEOUS) ×12 IMPLANT
DRSG TEGADERM 2-3/8X2-3/4 SM (GAUZE/BANDAGES/DRESSINGS) ×3 IMPLANT
GLOVE BIOGEL PI IND STRL 8 (GLOVE) ×1 IMPLANT
GLOVE BIOGEL PI INDICATOR 8 (GLOVE) ×2
GOWN STRL REUS W/ TWL LRG LVL3 (GOWN DISPOSABLE) ×1 IMPLANT
GOWN STRL REUS W/ TWL XL LVL3 (GOWN DISPOSABLE) ×1 IMPLANT
GOWN STRL REUS W/TWL LRG LVL3 (GOWN DISPOSABLE) ×2
GOWN STRL REUS W/TWL XL LVL3 (GOWN DISPOSABLE) ×2
KNIFE 45D UP 2.3 (MISCELLANEOUS) ×3 IMPLANT
LENS IOL TECNIS ITEC 21.5 (Intraocular Lens) ×2 IMPLANT
PACK CATARACT (MISCELLANEOUS) ×3 IMPLANT
PACK DR. KING ARMS (PACKS) ×3 IMPLANT
PACK EYE AFTER SURG (MISCELLANEOUS) ×3 IMPLANT
SOLUTION OPHTHALMIC SALT (MISCELLANEOUS) ×3 IMPLANT
WATER STERILE IRR 250ML POUR (IV SOLUTION) ×3 IMPLANT
WIPE NON LINTING 3.25X3.25 (MISCELLANEOUS) ×3 IMPLANT

## 2019-06-05 NOTE — Transfer of Care (Signed)
Immediate Anesthesia Transfer of Care Note  Patient: Jeremy Atkinson  Procedure(s) Performed: CATARACT EXTRACTION PHACO AND INTRAOCULAR LENS PLACEMENT (IOC) RIGHT VISION BLUE CDE:  4.82, Total U/S Time:  00:37.8, FP3:  12.7% (Right Eye)  Patient Location: PACU  Anesthesia Type: MAC  Level of Consciousness: awake, alert  and patient cooperative  Airway and Oxygen Therapy: Patient Spontanous Breathing   Post-op Assessment: Post-op Vital signs reviewed, Patient's Cardiovascular Status Stable, Respiratory Function Stable, Patent Airway and No signs of Nausea or vomiting  Post-op Vital Signs: Reviewed and stable  Complications: No apparent anesthesia complications

## 2019-06-05 NOTE — Anesthesia Preprocedure Evaluation (Signed)
Anesthesia Evaluation  Patient identified by MRN, date of birth, ID band Patient awake    Reviewed: Allergy & Precautions, H&P , NPO status , Patient's Chart, lab work & pertinent test results  Airway Mallampati: II  TM Distance: >3 FB Neck ROM: full    Dental no notable dental hx.    Pulmonary former smoker,    Pulmonary exam normal breath sounds clear to auscultation       Cardiovascular hypertension, Normal cardiovascular exam Rhythm:regular Rate:Normal     Neuro/Psych    GI/Hepatic PUD,   Endo/Other  Hypothyroidism   Renal/GU Renal disease     Musculoskeletal   Abdominal   Peds  Hematology   Anesthesia Other Findings   Reproductive/Obstetrics                             Anesthesia Physical Anesthesia Plan  ASA: II  Anesthesia Plan: MAC   Post-op Pain Management:    Induction:   PONV Risk Score and Plan: 1 and Treatment may vary due to age or medical condition and Midazolam  Airway Management Planned:   Additional Equipment:   Intra-op Plan:   Post-operative Plan:   Informed Consent: I have reviewed the patients History and Physical, chart, labs and discussed the procedure including the risks, benefits and alternatives for the proposed anesthesia with the patient or authorized representative who has indicated his/her understanding and acceptance.     Dental Advisory Given  Plan Discussed with: CRNA  Anesthesia Plan Comments:         Anesthesia Quick Evaluation

## 2019-06-05 NOTE — Anesthesia Procedure Notes (Signed)
Procedure Name: MAC Date/Time: 06/05/2019 7:35 AM Performed by: Vanetta Shawl, CRNA Pre-anesthesia Checklist: Patient identified, Emergency Drugs available, Suction available, Timeout performed and Patient being monitored Patient Re-evaluated:Patient Re-evaluated prior to induction Oxygen Delivery Method: Nasal cannula Placement Confirmation: positive ETCO2

## 2019-06-05 NOTE — Op Note (Signed)
  PREOPERATIVE DIAGNOSIS:  Nuclear sclerotic cataract of the RIGHT eye.   POSTOPERATIVE DIAGNOSIS:  Nuclear sclerotic cataract of the RIGHT eye.   OPERATIVE PROCEDURE: Cataract surgery OD   SURGEON:  Marchia Meiers, MD.   ANESTHESIA:  Anesthesiologist: Ronelle Nigh, MD CRNA: Vanetta Shawl, CRNA  1.      Managed anesthesia care. 2.     0.78ml of Shugarcaine was instilled following the paracentesis   COMPLICATIONS:  None.   TECHNIQUE:   Divide and conquer   DESCRIPTION OF PROCEDURE:  The patient was examined and consented in the preoperative holding area where the aforementioned topical anesthesia was applied to the RIGHT eye and then brought back to the Operating Room where the RIGHT eye was prepped and draped in the usual sterile ophthalmic fashion and a lid speculum was placed. A paracentesis was created with the side port blade, the anterior chamber was washed out with trypan blue to stain the anterior capsule, and the anterior chamber was filled with viscoelastic. A near clear corneal incision was performed with the steel keratome. A continuous curvilinear capsulorrhexis was performed with a cystotome followed by the capsulorrhexis forceps. Hydrodissection and hydrodelineation were carried out with BSS on a blunt cannula. The lens was removed in a divide and conquer  technique and the remaining cortical material was removed with the irrigation-aspiration handpiece. The capsular bag was inflated with viscoelastic and the lens was placed in the capsular bag without complication. The remaining viscoelastic was removed from the eye with the irrigation-aspiration handpiece. The wounds were hydrated. The anterior chamber was flushed and the eye was inflated to physiologic pressure. 0.46ml Vigamox was placed in the anterior chamber. The wounds were found to be water tight. The eye was dressed with Vigamox. The patient was given protective glasses to wear throughout the day and a shield with which  to sleep tonight. The patient was also given drops with which to begin a drop regimen today and will follow-up with me in one day. Implant Name Type Inv. Item Serial No. Manufacturer Lot No. LRB No. Used Action  LENS IOL DIOP 21.5 - [id number] Intraocular Lens LENS IOL DIOP 21.5 [id number]  Right 1 Implanted    Procedure(s): CATARACT EXTRACTION PHACO AND INTRAOCULAR LENS PLACEMENT (IOC) RIGHT VISION BLUE CDE:  4.82, Total U/S Time:  00:37.8, FP3:  12.7% (Right)  Electronically signed: Marchia Meiers 06/05/2019 8:22 AM

## 2019-06-05 NOTE — Anesthesia Postprocedure Evaluation (Signed)
Anesthesia Post Note  Patient: Jeremy Atkinson  Procedure(s) Performed: CATARACT EXTRACTION PHACO AND INTRAOCULAR LENS PLACEMENT (IOC) RIGHT VISION BLUE CDE:  4.82, Total U/S Time:  00:37.8, FP3:  12.7% (Right Eye)     Patient location during evaluation: PACU Anesthesia Type: MAC Level of consciousness: awake and alert and oriented Pain management: satisfactory to patient Vital Signs Assessment: post-procedure vital signs reviewed and stable Respiratory status: spontaneous breathing, nonlabored ventilation and respiratory function stable Cardiovascular status: blood pressure returned to baseline and stable Postop Assessment: Adequate PO intake and No signs of nausea or vomiting Anesthetic complications: no    Raliegh Ip

## 2019-06-05 NOTE — H&P (Signed)
   I have reviewed the patient's H&P and agree with its findings. There have been no interval changes.  Steffen Hase MD Ophthalmology 

## 2019-06-06 ENCOUNTER — Encounter: Payer: Self-pay | Admitting: *Deleted

## 2019-06-24 ENCOUNTER — Other Ambulatory Visit: Payer: Self-pay | Admitting: Family Medicine

## 2019-06-24 DIAGNOSIS — R221 Localized swelling, mass and lump, neck: Secondary | ICD-10-CM

## 2019-07-04 ENCOUNTER — Other Ambulatory Visit: Payer: Self-pay

## 2019-07-04 ENCOUNTER — Ambulatory Visit
Admission: RE | Admit: 2019-07-04 | Discharge: 2019-07-04 | Disposition: A | Discharge status: Home / Self Care | Payer: Medicare PPO | Source: Ambulatory Visit | Attending: Family Medicine | Admitting: Family Medicine

## 2019-07-04 DIAGNOSIS — R221 Localized swelling, mass and lump, neck: Secondary | ICD-10-CM | POA: Insufficient documentation

## 2019-07-04 LAB — POCT I-STAT CREATININE: Creatinine, Ser: 1.3 mg/dL — ABNORMAL HIGH (ref 0.61–1.24)

## 2019-07-04 MED ORDER — IOHEXOL 300 MG/ML  SOLN
75.0000 mL | Freq: Once | INTRAMUSCULAR | Status: AC | PRN
  Administered 2019-07-04: 75 mL via INTRAVENOUS

## 2019-07-08 ENCOUNTER — Other Ambulatory Visit: Payer: Self-pay

## 2019-07-08 ENCOUNTER — Encounter: Payer: Self-pay | Admitting: Ophthalmology

## 2019-07-10 NOTE — Discharge Instructions (Signed)

## 2019-07-11 ENCOUNTER — Other Ambulatory Visit: Payer: Self-pay

## 2019-07-11 ENCOUNTER — Other Ambulatory Visit
Admission: RE | Admit: 2019-07-11 | Discharge: 2019-07-11 | Disposition: A | Discharge status: Home / Self Care | Payer: Medicare PPO | Source: Ambulatory Visit | Attending: Ophthalmology | Admitting: Ophthalmology

## 2019-07-11 DIAGNOSIS — Z20822 Contact with and (suspected) exposure to covid-19: Secondary | ICD-10-CM | POA: Insufficient documentation

## 2019-07-11 DIAGNOSIS — Z01812 Encounter for preprocedural laboratory examination: Secondary | ICD-10-CM | POA: Insufficient documentation

## 2019-07-11 LAB — SARS CORONAVIRUS 2 (TAT 6-24 HRS): SARS Coronavirus 2: NEGATIVE

## 2019-07-15 ENCOUNTER — Encounter: Payer: Self-pay | Admitting: Ophthalmology

## 2019-07-15 ENCOUNTER — Ambulatory Visit
Admission: RE | Admit: 2019-07-15 | Discharge: 2019-07-15 | Disposition: A | Discharge status: Home / Self Care | Payer: Medicare PPO | Attending: Ophthalmology | Admitting: Ophthalmology

## 2019-07-15 ENCOUNTER — Ambulatory Visit: Payer: Medicare PPO | Admitting: Anesthesiology

## 2019-07-15 ENCOUNTER — Other Ambulatory Visit: Payer: Self-pay

## 2019-07-15 ENCOUNTER — Encounter
Admission: RE | Disposition: A | Discharge status: Home / Self Care | Payer: Self-pay | Source: Home / Self Care | Attending: Ophthalmology

## 2019-07-15 DIAGNOSIS — Z87891 Personal history of nicotine dependence: Secondary | ICD-10-CM | POA: Insufficient documentation

## 2019-07-15 DIAGNOSIS — H2512 Age-related nuclear cataract, left eye: Secondary | ICD-10-CM | POA: Insufficient documentation

## 2019-07-15 HISTORY — PX: CATARACT EXTRACTION W/PHACO: SHX586

## 2019-07-15 SURGERY — PHACOEMULSIFICATION, CATARACT, WITH IOL INSERTION
Anesthesia: Monitor Anesthesia Care | Site: Eye | Laterality: Left

## 2019-07-15 MED ORDER — FENTANYL CITRATE (PF) 100 MCG/2ML IJ SOLN
INTRAMUSCULAR | Status: DC | PRN
  Administered 2019-07-15 (×2): 50 ug via INTRAVENOUS

## 2019-07-15 MED ORDER — LIDOCAINE HCL (PF) 2 % IJ SOLN
INTRAOCULAR | Status: DC | PRN
  Administered 2019-07-15: 2 mL via INTRAOCULAR

## 2019-07-15 MED ORDER — NA CHONDROIT SULF-NA HYALURON 40-17 MG/ML IO SOLN
INTRAOCULAR | Status: DC | PRN
  Administered 2019-07-15: 1 mL via INTRAOCULAR

## 2019-07-15 MED ORDER — LACTATED RINGERS IV SOLN: 10.0000 mL/h | INTRAVENOUS | Status: DC

## 2019-07-15 MED ORDER — PROVISC 10 MG/ML IO SOLN
INTRAOCULAR | Status: DC | PRN
  Administered 2019-07-15: 0.55 mL via INTRAOCULAR

## 2019-07-15 MED ORDER — BRIMONIDINE TARTRATE-TIMOLOL 0.2-0.5 % OP SOLN
OPHTHALMIC | Status: DC | PRN
  Administered 2019-07-15: 1 [drp] via OPHTHALMIC

## 2019-07-15 MED ORDER — EPINEPHRINE PF 1 MG/ML IJ SOLN
INTRAOCULAR | Status: DC | PRN
  Administered 2019-07-15: 66 mL via OPHTHALMIC

## 2019-07-15 MED ORDER — ACETAMINOPHEN 325 MG PO TABS: 325.0000 mg | ORAL_TABLET | Freq: Once | ORAL | Status: DC

## 2019-07-15 MED ORDER — MOXIFLOXACIN HCL 0.5 % OP SOLN
OPHTHALMIC | Status: DC | PRN
  Administered 2019-07-15: 0.2 mL via OPHTHALMIC

## 2019-07-15 MED ORDER — TRYPAN BLUE 0.06 % OP SOLN
OPHTHALMIC | Status: DC | PRN
  Administered 2019-07-15: 0.5 mL via INTRAOCULAR

## 2019-07-15 MED ORDER — TETRACAINE HCL 0.5 % OP SOLN
1.0000 [drp] | OPHTHALMIC | Status: DC | PRN
  Administered 2019-07-15 (×3): 1 [drp] via OPHTHALMIC

## 2019-07-15 MED ORDER — MIDAZOLAM HCL 2 MG/2ML IJ SOLN
INTRAMUSCULAR | Status: DC | PRN
  Administered 2019-07-15 (×2): 1 mg via INTRAVENOUS

## 2019-07-15 MED ORDER — ARMC OPHTHALMIC DILATING DROPS
1.0000 "application " | OPHTHALMIC | Status: DC | PRN
  Administered 2019-07-15 (×3): 1 via OPHTHALMIC

## 2019-07-15 MED ORDER — ACETAMINOPHEN 160 MG/5ML PO SOLN: 325.0000 mg | Freq: Once | ORAL | Status: DC

## 2019-07-15 MED ORDER — TETRACAINE 0.5 % OP SOLN OPTIME - NO CHARGE
OPHTHALMIC | Status: DC | PRN
  Administered 2019-07-15: 08:00:00 2 [drp] via OPHTHALMIC

## 2019-07-15 SURGICAL SUPPLY — 17 items
DISSECTOR HYDRO NUCLEUS 50X22 (MISCELLANEOUS) ×12 IMPLANT
DRSG TEGADERM 2-3/8X2-3/4 SM (GAUZE/BANDAGES/DRESSINGS) ×3 IMPLANT
GLOVE BIOGEL PI IND STRL 8 (GLOVE) ×1 IMPLANT
GLOVE BIOGEL PI INDICATOR 8 (GLOVE) ×2
GOWN STRL REUS W/ TWL LRG LVL3 (GOWN DISPOSABLE) ×1 IMPLANT
GOWN STRL REUS W/ TWL XL LVL3 (GOWN DISPOSABLE) ×1 IMPLANT
GOWN STRL REUS W/TWL LRG LVL3 (GOWN DISPOSABLE) ×2
GOWN STRL REUS W/TWL XL LVL3 (GOWN DISPOSABLE) ×2
KNIFE 45D UP 2.3 (MISCELLANEOUS) ×3 IMPLANT
LENS IOL DIOP 22.0 (Intraocular Lens) ×3 IMPLANT
LENS IOL TECNIS MONO 22.0 (Intraocular Lens) IMPLANT
PACK CATARACT (MISCELLANEOUS) ×3 IMPLANT
PACK DR. KING ARMS (PACKS) ×3 IMPLANT
PACK EYE AFTER SURG (MISCELLANEOUS) ×3 IMPLANT
SOLUTION OPHTHALMIC SALT (MISCELLANEOUS) ×3 IMPLANT
WATER STERILE IRR 250ML POUR (IV SOLUTION) ×3 IMPLANT
WIPE NON LINTING 3.25X3.25 (MISCELLANEOUS) ×3 IMPLANT

## 2019-07-15 NOTE — Anesthesia Postprocedure Evaluation (Signed)
Anesthesia Post Note  Patient: Jeremy Atkinson  Procedure(s) Performed: CATARACT EXTRACTION PHACO AND INTRAOCULAR LENS PLACEMENT (IOC) LEFT VISION BLUE 6.90 00:57.1 (Left Eye)     Patient location during evaluation: PACU Anesthesia Type: MAC Level of consciousness: awake and alert and oriented Pain management: satisfactory to patient Vital Signs Assessment: post-procedure vital signs reviewed and stable Respiratory status: spontaneous breathing, nonlabored ventilation and respiratory function stable Cardiovascular status: blood pressure returned to baseline and stable Postop Assessment: Adequate PO intake and No signs of nausea or vomiting Anesthetic complications: no    Raliegh Ip

## 2019-07-15 NOTE — Op Note (Signed)
  PREOPERATIVE DIAGNOSIS:  Nuclear sclerotic cataract of the LEFT eye.   POSTOPERATIVE DIAGNOSIS:  Nuclear sclerotic cataract of the LEFT eye.   OPERATIVE PROCEDURE: Cataract surgery OS   SURGEON:  Marchia Meiers, MD.   ANESTHESIA:  Anesthesiologist: Ronelle Nigh, MD CRNA: Vanetta Shawl, CRNA  1.      Managed anesthesia care. 2.     0.63ml of Shugarcaine was instilled following the paracentesis   COMPLICATIONS:  None.   TECHNIQUE:   Divide and conquer   DESCRIPTION OF PROCEDURE:  The patient was examined and consented in the preoperative holding area where the aforementioned topical anesthesia was applied to the LEFT eye and then brought back to the Operating Room where the left eye was prepped and draped in the usual sterile ophthalmic fashion and a lid speculum was placed. A paracentesis was created with the side port blade, the anterior chamber was washed out with trypan blue to stain the anterior capsule, and the anterior chamber was filled with viscoelastic. A near clear corneal incision was performed with the steel keratome. A continuous curvilinear capsulorrhexis was performed with a cystotome followed by the capsulorrhexis forceps. Hydrodissection and hydrodelineation were carried out with BSS on a blunt cannula. The lens was removed in a divide and conquer  technique and the remaining cortical material was removed with the irrigation-aspiration handpiece. The capsular bag was inflated with viscoelastic and the lens was placed in the capsular bag without complication. The remaining viscoelastic was removed from the eye with the irrigation-aspiration handpiece. The wounds were hydrated. The anterior chamber was flushed and the eye was inflated to physiologic pressure. 0.69ml Vigamox was placed in the anterior chamber. The wounds were found to be water tight. The eye was dressed with Vigamox. The patient was given protective glasses to wear throughout the day and a shield with which to  sleep tonight. The patient was also given drops with which to begin a drop regimen today and will follow-up with me in one day. Implant Name Type Inv. Item Serial No. Manufacturer Lot No. LRB No. Used Action  LENS IOL DIOP 22.0 - [id number] Intraocular Lens LENS IOL DIOP 22.0 [id number]  Left 1 Implanted    Procedure(s): CATARACT EXTRACTION PHACO AND INTRAOCULAR LENS PLACEMENT (IOC) LEFT VISION BLUE 6.90 00:57.1 (Left)  Electronically signed: Roniel Halloran 07/15/2019 8:12 AM

## 2019-07-15 NOTE — Anesthesia Procedure Notes (Signed)
Procedure Name: MAC Performed by: Vanetta Shawl, CRNA Pre-anesthesia Checklist: Patient identified, Emergency Drugs available, Suction available, Timeout performed and Patient being monitored Patient Re-evaluated:Patient Re-evaluated prior to induction Oxygen Delivery Method: Nasal cannula Placement Confirmation: positive ETCO2

## 2019-07-15 NOTE — H&P (Signed)
   I have reviewed the patient's H&P and agree with its findings. There have been no interval changes.  Kim Oki MD Ophthalmology 

## 2019-07-15 NOTE — Transfer of Care (Signed)
Immediate Anesthesia Transfer of Care Note  Patient: Jeremy Atkinson  Procedure(s) Performed: CATARACT EXTRACTION PHACO AND INTRAOCULAR LENS PLACEMENT (IOC) LEFT VISION BLUE 6.90 00:57.1 (Left Eye)  Patient Location: PACU  Anesthesia Type: MAC  Level of Consciousness: awake, alert  and patient cooperative  Airway and Oxygen Therapy: Patient Spontanous Breathing   Post-op Assessment: Post-op Vital signs reviewed, Patient's Cardiovascular Status Stable, Respiratory Function Stable, Patent Airway and No signs of Nausea or vomiting  Post-op Vital Signs: Reviewed and stable  Complications: No apparent anesthesia complications

## 2019-07-15 NOTE — Anesthesia Preprocedure Evaluation (Signed)
Anesthesia Evaluation  Patient identified by MRN, date of birth, ID band Patient awake    Reviewed: Allergy & Precautions, H&P , NPO status , Patient's Chart, lab work & pertinent test results  Airway Mallampati: III  TM Distance: >3 FB Neck ROM: full    Dental no notable dental hx.    Pulmonary former smoker,    Pulmonary exam normal breath sounds clear to auscultation       Cardiovascular hypertension, Normal cardiovascular exam Rhythm:regular Rate:Normal     Neuro/Psych    GI/Hepatic PUD,   Endo/Other  Hypothyroidism   Renal/GU Renal disease     Musculoskeletal   Abdominal   Peds  Hematology   Anesthesia Other Findings   Reproductive/Obstetrics                             Anesthesia Physical  Anesthesia Plan  ASA: II  Anesthesia Plan: MAC   Post-op Pain Management:    Induction:   PONV Risk Score and Plan: 1 and Treatment may vary due to age or medical condition, Midazolam and TIVA  Airway Management Planned:   Additional Equipment:   Intra-op Plan:   Post-operative Plan:   Informed Consent: I have reviewed the patients History and Physical, chart, labs and discussed the procedure including the risks, benefits and alternatives for the proposed anesthesia with the patient or authorized representative who has indicated his/her understanding and acceptance.     Dental Advisory Given  Plan Discussed with: CRNA  Anesthesia Plan Comments:         Anesthesia Quick Evaluation

## 2019-07-16 ENCOUNTER — Encounter: Payer: Self-pay | Admitting: *Deleted

## 2019-07-24 ENCOUNTER — Other Ambulatory Visit: Payer: Self-pay

## 2019-07-24 ENCOUNTER — Other Ambulatory Visit: Payer: Self-pay | Admitting: General Surgery

## 2019-07-24 ENCOUNTER — Encounter: Payer: Self-pay | Admitting: General Surgery

## 2019-07-24 ENCOUNTER — Other Ambulatory Visit
Admission: RE | Admit: 2019-07-24 | Discharge: 2019-07-24 | Disposition: A | Discharge status: Home / Self Care | Payer: Medicare PPO | Source: Ambulatory Visit | Attending: General Surgery | Admitting: General Surgery

## 2019-07-24 ENCOUNTER — Ambulatory Visit: Payer: Medicare PPO | Admitting: General Surgery

## 2019-07-24 VITALS — BP 152/76 | HR 84 | Temp 97.3°F | Resp 12 | Ht 72.0 in | Wt 227.4 lb

## 2019-07-24 DIAGNOSIS — E069 Thyroiditis, unspecified: Secondary | ICD-10-CM | POA: Diagnosis not present

## 2019-07-24 LAB — TSH: TSH: 2.619 u[IU]/mL (ref 0.350–4.500)

## 2019-07-24 LAB — T4, FREE: Free T4: 0.81 ng/dL (ref 0.61–1.12)
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
## 2019-07-24 NOTE — Patient Instructions (Addendum)
Dr Celine Ahr wants you to have labs drawn. Please go to the Lab at the [facility] to have these done.   Our surgery scheduler will contact you to schedule your surgery. Please have the BLUE SHEET available when she calls you.  Thyroidectomy A thyroidectomy is a surgery that is done to remove the thyroid gland. The thyroid is a butterfly-shaped gland that is located at the lower front of your neck. It produces thyroid hormone, which is a substance that helps to control certain body processes. You may have a:  Total thyroidectomy. All of your thyroid is removed.  Thyroid lobectomy. Part of your thyroid is removed. The amount of thyroid gland tissue that is removed during your surgery depends on the reason for the procedure. Reasons to have this procedure include treatment for:  Thyroid nodules.  Thyroid cancer.  Benign thyroid tumors.  Goiter.  Overactive thyroid gland (hyperthyroidism). There are two ways to do this procedure. Conventional, or open, thyroidectomy uses one large incision to remove the thyroid gland. This is the most common method. Endoscopic thyroidectomy, a less invasive method, uses a narrow tube with a light and camera (endoscope) to remove the gland. Tell a health care provider about:  Any allergies you have.  All medicines you are taking, including vitamins, herbs, eye drops, creams, and over-the-counter medicines.  Any problems you or family members have had with anesthetic medicines.  Any blood disorders you have.  Any surgeries you have had.  Any medical conditions you have.  Whether you are pregnant or may be pregnant. What are the risks? Generally, this is a safe procedure. However, problems may occur, including:  Damage to the parathyroid glands. These are located behind your thyroid gland. They maintain the calcium levels in the body. Damage may lead to: ? A decrease in parathyroid hormone levels (hypoparathyroidism). ? A decrease  in calcium levels. This will make your nerves irritable and may cause muscle spasms.  An increase in thyroid hormone.  Damage to the nerves of your voice box (larynx). This can be temporary or long-term (rare).  Hoarseness. This usually resolves in 24-48 hours.  Bleeding.  Infection. What happens before the procedure? Staying hydrated Follow instructions from your health care provider about hydration, which may include:  Up to 2 hours before the procedure - you may continue to drink clear liquids, such as water, clear fruit juice, black coffee, and plain tea. Eating and drinking restrictions Follow instructions from your health care provider about eating and drinking, which may include:  8 hours before the procedure - stop eating heavy meals or foods such as meat, fried foods, or fatty foods.  6 hours before the procedure - stop eating light meals or foods, such as toast or cereal.  6 hours before the procedure - stop drinking milk or drinks that contain milk.  2 hours before the procedure - stop drinking clear liquids. Medicines Ask your health care provider about:  Changing or stopping your regular medicines. This is especially important if you are taking diabetes medicines or blood thinners.  Taking medicines such as aspirin and ibuprofen. These medicines can thin your blood. Do not take these medicines unless your health care provider tells you to take them.  Taking over-the-counter medicines, vitamins, herbs, and supplements. General instructions  You may be asked to shower with a germ-killing soap.  Plan to have someone take you home from the hospital or clinic.  Plan to have a responsible adult care for you for  at least 24 hours after you leave the hospital or clinic. This is important. What happens during the procedure?  To reduce your risk of infection: ? Your health care team will wash or sanitize their hands. ? Hair may be removed from the surgical  area. ? Your skin will be washed with soap.  An IV will be inserted into one of your veins.  You will be given one or more of the following: ? A medicine to help you relax (sedative). ? A medicine to make you fall asleep (general anesthetic).  Your health care provider will perform your surgery using one of two methods: ? For open thyroidectomy, an incision will be made in your lower neck. Muscles in the area will be separated to reveal your thyroid gland. ? For endoscopic thyroidectomy, several small incisions will be made in your neck, chest, or armpit. An endoscopewill be inserted into an incision.  Your health care provider may monitor laryngeal nerve function during the procedure for safety reasons.  Part or all of your thyroid gland will be removed.  A tube (drain) may be placed at the incision site to drain blood and fluids that accumulate under the skin after the procedure. The drain may have to stay in place for a day or two after the procedure.  The incision will be closed with stitches (sutures).  A dressing will be placed over your incision. The procedure may vary among health care providers and hospitals. What happens after the procedure?  Your blood pressure, heart rate, breathing rate, and blood oxygen level will be monitored often until the medicines you were given have worn off.  You will be given pain medicine as needed.  Your provider will check your ability to talk and swallow after the procedure.  You will gradually start to drink liquids and have soft foods as tolerated.  You may have a blood test to check the level of calcium in your body.  If you had a drain put in during the procedure, it will usually be removed the next day. Summary  A thyroidectomy is a surgery that is done to remove the thyroid gland.  The procedure will be done in one of two ways: conventional, or open, thyroidectomy or endoscopic thyroidectomy.  Serious complications are  rare.  Plan to have a responsible adult care for you for at least 24 hours after you leave the hospital or clinic. This is important. This information is not intended to replace advice given to you by your health care provider. Make sure you discuss any questions you have with your health care provider. Document Revised: 03/09/2017 Document Reviewed: 01/30/2017 Elsevier Patient Education  2020 [employer].

## 2019-07-25 ENCOUNTER — Telehealth: Payer: Self-pay | Admitting: General Surgery

## 2019-07-25 LAB — THYROID PEROXIDASE ANTIBODY: Thyroperoxidase Ab SerPl-aCnc: 467 IU/mL — ABNORMAL HIGH (ref 0–34)

## 2019-07-25 LAB — T3, FREE: T3, Free: 2.8 pg/mL (ref 2.0–4.4)

## 2019-07-25 NOTE — Telephone Encounter (Signed)
Spoke with wife, Santiago Glad, she and patient has been advised of Pre-Admission date/time, COVID Testing date and Surgery date.  Surgery Date: 08/18/19 Preadmission Testing Date: 08/12/19 (phone 8a-1p) Covid Testing Date: 08/14/19 - patient advised to go to the [facility] between 8a-1p   Patient has been made aware to call [phone], between 1-3:00pm the day before surgery, to find out what time to arrive for surgery.

## 2019-08-12 ENCOUNTER — Other Ambulatory Visit
Admission: RE | Admit: 2019-08-12 | Discharge: 2019-08-12 | Disposition: A | Discharge status: Home / Self Care | Payer: Medicare PPO | Source: Ambulatory Visit | Attending: General Surgery | Admitting: General Surgery

## 2019-08-12 ENCOUNTER — Other Ambulatory Visit: Payer: Self-pay

## 2019-08-12 DIAGNOSIS — Z01818 Encounter for other preprocedural examination: Secondary | ICD-10-CM | POA: Insufficient documentation

## 2019-08-12 NOTE — Patient Instructions (Addendum)
INSTRUCTIONS FOR SURGERY     Your surgery is scheduled for: Monday, MAY 10th       To find out your arrival time for the day of surgery,          please call [phone] between 1 pm and 3 pm on :  Friday, MAY 7th     When you arrive for surgery, report to the [facility].       Do NOT stop on the first floor to register.    REMEMBER: Instructions that are not followed completely may result in serious medical risk,  up to and including death, or upon the discretion of your surgeon and anesthesiologist,            your surgery may need to be rescheduled.  __X__ 1. Do not eat food after midnight the night before your procedure.                    No gum, candy, lozenger, tic tacs, tums or hard candies.                  ABSOLUTELY NOTHING SOLID IN YOUR MOUTH AFTER MIDNIGHT                    You may drink unlimited clear liquids up to 2 hours before you are scheduled to arrive for surgery.                   Do not drink anything within those 2 hours unless you need to take medicine, then take the                   smallest amount you need.  Clear liquids include:  water, apple juice without pulp,                   any flavor Gatorade, Black coffee, black tea.  Sugar may be added but no dairy/ honey /lemon.                        Broth and jello is not considered a clear liquid.  __x__  2. On the morning of surgery, please brush your teeth with toothpaste and water. You may rinse with                  mouthwash if you wish but DO NOT SWALLOW TOOTHPASTE OR MOUTHWASH  __X___3. NO alcohol for 24 hours before or after surgery.  __x___ 4.  Do NOT smoke or use e-cigarettes for 24 HOURS PRIOR TO SURGERY.                      DO NOT Use any chewable tobacco products for at least 6 hours prior to surgery.  __x___ 5. If you start any new medication after this appointment and prior to surgery, please  Bring it with you on the day of surgery.  ___x__ 6. Notify your doctor if there is any change in your medical condition, such as fever,  infection, vomitting, diarrhea or any open sores.  __x___ 7.  USE the CHG SOAP as instructed, the night before surgery and the day of surgery.                   Once you have washed with this soap, do NOT use any of the following: Powders, perfumes                    or lotions. Please do not wear make up, hairpins, clips or nail polish. You MAY wear deodorant.                   Men may shave their face and neck.  Women need to shave 48 hours prior to surgery.                   DO NOT wear ANY jewelry on the day of surgery. If there are rings that are too tight to                    remove easily, please address this prior to the surgery day. Piercings need to be removed.                                                                     NO METAL ON YOUR BODY.                    Do NOT bring any valuables.  If you came to Pre-Admit testing then you will not need license,                     insurance card or credit card.  If you will be staying overnight, please either leave your things in                     the car or have your family be responsible for these items.                     [facility] IS NOT RESPONSIBLE FOR BELONGINGS OR VALUABLES.  ___X__ 8. DO NOT wear contact lenses on surgery day.  You may not have dentures,                     Hearing aides, contacts or glasses in the operating room. These items can be                    Placed in the Recovery Room to receive immediately after surgery.  __x___ 9. IF YOU ARE SCHEDULED TO GO HOME ON THE SAME DAY, YOU MUST                   Have someone to drive you home and to stay with you  for the first 24 hours.                    Have an arrangement prior to arriving on surgery day.  ___x__ 10. Take the following medications on the morning of surgery with a sip of water:  1.ALLOPURINOL                     2.SYNTHROID                     3.                      _____ 11.  Follow any instructions provided to you by your surgeon.                        Such as enema, clear liquid bowel prep  __X__  12. STOP ALL ASPIRIN PRODUCTS AS OF TODAY, MAY 4TH                        THIS INCLUDES BC POWDERS / GOODIES POWDER  __x___ 13. STOP Anti-inflammatories as of: TODAY, MAY 4TH                      This includes IBUPROFEN / MOTRIN / ADVIL / [place].  ___X__ 14.  Stop supplements until after surgery.                     This includes: VITAMIN B12 COMBO / COQ10 / TART CHERRY                 You may continue taking Vitamin B12 / Vitamin D3 but do not take on the morning of surgery.  __X____17.  Continue to take the following medications but do not take on the morning of surgery:                         LISINOPRIL  __X____18. If staying overnight, please have appropriate shoes to wear to be able to walk around the unit.                   Wear clean and comfortable clothing to the hospital.  CONTINUE TAKING LIPITOR AS USUAL.  BRING PHONE NUMBERS FOR YOUR CONTACT PEOPLE.  [place] A PHONE CHARGER

## 2019-08-13 ENCOUNTER — Other Ambulatory Visit: Payer: Medicare PPO

## 2019-08-14 ENCOUNTER — Encounter
Admission: RE | Admit: 2019-08-14 | Discharge: 2019-08-14 | Disposition: A | Discharge status: Home / Self Care | Payer: Medicare PPO | Source: Ambulatory Visit | Attending: General Surgery | Admitting: General Surgery

## 2019-08-14 ENCOUNTER — Other Ambulatory Visit: Payer: Self-pay

## 2019-08-14 DIAGNOSIS — Z20822 Contact with and (suspected) exposure to covid-19: Secondary | ICD-10-CM | POA: Insufficient documentation

## 2019-08-14 DIAGNOSIS — I10 Essential (primary) hypertension: Secondary | ICD-10-CM | POA: Diagnosis not present

## 2019-08-14 DIAGNOSIS — Z01818 Encounter for other preprocedural examination: Secondary | ICD-10-CM | POA: Diagnosis not present

## 2019-08-14 LAB — CBC
HCT: 40.7 % (ref 39.0–52.0)
Hemoglobin: 14.7 g/dL (ref 13.0–17.0)
MCH: 33.9 pg (ref 26.0–34.0)
MCHC: 36.1 g/dL — ABNORMAL HIGH (ref 30.0–36.0)
MCV: 94 fL (ref 80.0–100.0)
Platelets: 165 10*3/uL (ref 150–400)
RBC: 4.33 MIL/uL (ref 4.22–5.81)
RDW: 13.2 % (ref 11.5–15.5)
WBC: 6.7 10*3/uL (ref 4.0–10.5)
nRBC: 0 % (ref 0.0–0.2)

## 2019-08-14 LAB — BASIC METABOLIC PANEL
Anion gap: 6 (ref 5–15)
BUN: 20 mg/dL (ref 8–23)
CO2: 26 mmol/L (ref 22–32)
Calcium: 9.3 mg/dL (ref 8.9–10.3)
Chloride: 106 mmol/L (ref 98–111)
Creatinine, Ser: 1.4 mg/dL — ABNORMAL HIGH (ref 0.61–1.24)
GFR calc Af Amer: 59 mL/min — ABNORMAL LOW (ref >60)
GFR calc non Af Amer: 51 mL/min — ABNORMAL LOW (ref >60)
Glucose, Bld: 109 mg/dL — ABNORMAL HIGH (ref 70–99)
Potassium: 4.2 mmol/L (ref 3.5–5.1)
Sodium: 138 mmol/L (ref 135–145)

## 2019-08-14 LAB — SARS CORONAVIRUS 2 (TAT 6-24 HRS): SARS Coronavirus 2: NEGATIVE

## 2019-08-18 ENCOUNTER — Ambulatory Visit: Payer: Medicare PPO | Admitting: Anesthesiology

## 2019-08-18 ENCOUNTER — Observation Stay
Admission: RE | Admit: 2019-08-18 | Discharge: 2019-08-19 | Disposition: A | Discharge status: Home / Self Care | Payer: Medicare PPO | Attending: General Surgery | Admitting: General Surgery

## 2019-08-18 ENCOUNTER — Encounter
Admission: RE | Disposition: A | Discharge status: Home / Self Care | Payer: Self-pay | Source: Home / Self Care | Attending: General Surgery

## 2019-08-18 ENCOUNTER — Other Ambulatory Visit: Payer: Self-pay

## 2019-08-18 ENCOUNTER — Encounter: Payer: Self-pay | Admitting: General Surgery

## 2019-08-18 DIAGNOSIS — N183 Chronic kidney disease, stage 3 unspecified: Secondary | ICD-10-CM | POA: Insufficient documentation

## 2019-08-18 DIAGNOSIS — Z87891 Personal history of nicotine dependence: Secondary | ICD-10-CM | POA: Diagnosis not present

## 2019-08-18 DIAGNOSIS — Z888 Allergy status to other drugs, medicaments and biological substances status: Secondary | ICD-10-CM | POA: Insufficient documentation

## 2019-08-18 DIAGNOSIS — Z885 Allergy status to narcotic agent status: Secondary | ICD-10-CM | POA: Insufficient documentation

## 2019-08-18 DIAGNOSIS — Z9049 Acquired absence of other specified parts of digestive tract: Secondary | ICD-10-CM | POA: Insufficient documentation

## 2019-08-18 DIAGNOSIS — R221 Localized swelling, mass and lump, neck: Secondary | ICD-10-CM | POA: Diagnosis not present

## 2019-08-18 DIAGNOSIS — E069 Thyroiditis, unspecified: Secondary | ICD-10-CM | POA: Diagnosis not present

## 2019-08-18 DIAGNOSIS — D170 Benign lipomatous neoplasm of skin and subcutaneous tissue of head, face and neck: Principal | ICD-10-CM | POA: Insufficient documentation

## 2019-08-18 DIAGNOSIS — I129 Hypertensive chronic kidney disease with stage 1 through stage 4 chronic kidney disease, or unspecified chronic kidney disease: Secondary | ICD-10-CM | POA: Diagnosis not present

## 2019-08-18 DIAGNOSIS — E063 Autoimmune thyroiditis: Secondary | ICD-10-CM | POA: Diagnosis present

## 2019-08-18 DIAGNOSIS — Z79890 Hormone replacement therapy (postmenopausal): Secondary | ICD-10-CM | POA: Diagnosis not present

## 2019-08-18 DIAGNOSIS — M1990 Unspecified osteoarthritis, unspecified site: Secondary | ICD-10-CM | POA: Insufficient documentation

## 2019-08-18 DIAGNOSIS — E890 Postprocedural hypothyroidism: Secondary | ICD-10-CM

## 2019-08-18 DIAGNOSIS — M109 Gout, unspecified: Secondary | ICD-10-CM | POA: Insufficient documentation

## 2019-08-18 DIAGNOSIS — Z79899 Other long term (current) drug therapy: Secondary | ICD-10-CM | POA: Insufficient documentation

## 2019-08-18 HISTORY — PX: THYROIDECTOMY: SHX17

## 2019-08-18 HISTORY — PX: LESION REMOVAL: SHX5196

## 2019-08-18 LAB — ALBUMIN: Albumin: 3.9 g/dL (ref 3.5–5.0)

## 2019-08-18 LAB — CALCIUM: Calcium: 9 mg/dL (ref 8.9–10.3)

## 2019-08-18 SURGERY — THYROIDECTOMY
Anesthesia: General | Site: Neck

## 2019-08-18 MED ORDER — VITAMIN D 25 MCG (1000 UNIT) PO TABS
1000.0000 [IU] | ORAL_TABLET | Freq: Every day | ORAL | Status: DC
  Administered 2019-08-18 – 2019-08-19 (×2): 1000 [IU] via ORAL
  Filled 2019-08-18 (×2): qty 1

## 2019-08-18 MED ORDER — LEVOTHYROXINE SODIUM 50 MCG PO TABS
150.0000 ug | ORAL_TABLET | Freq: Every day | ORAL | Status: DC
  Administered 2019-08-19: 150 ug via ORAL
  Filled 2019-08-18: qty 1

## 2019-08-18 MED ORDER — ONDANSETRON 4 MG PO TBDP: 4.0000 mg | ORAL_TABLET | Freq: Four times a day (QID) | ORAL | Status: DC | PRN

## 2019-08-18 MED ORDER — ONDANSETRON HCL 4 MG/2ML IJ SOLN
INTRAMUSCULAR | Status: AC
  Filled 2019-08-18: qty 2

## 2019-08-18 MED ORDER — FENTANYL CITRATE (PF) 100 MCG/2ML IJ SOLN
INTRAMUSCULAR | Status: AC
  Administered 2019-08-18: 25 ug via INTRAVENOUS
  Filled 2019-08-18: qty 2

## 2019-08-18 MED ORDER — IBUPROFEN 400 MG PO TABS: 600.0000 mg | ORAL_TABLET | Freq: Four times a day (QID) | ORAL | Status: DC | PRN

## 2019-08-18 MED ORDER — CELECOXIB 200 MG PO CAPS
ORAL_CAPSULE | ORAL | Status: AC
  Administered 2019-08-18: 200 mg via ORAL
  Filled 2019-08-18: qty 1

## 2019-08-18 MED ORDER — PROPOFOL 10 MG/ML IV BOLUS
INTRAVENOUS | Status: AC
  Filled 2019-08-18: qty 20

## 2019-08-18 MED ORDER — ONDANSETRON HCL 4 MG/2ML IJ SOLN
4.0000 mg | Freq: Once | INTRAMUSCULAR | Status: AC | PRN
  Administered 2019-08-18: 4 mg via INTRAVENOUS

## 2019-08-18 MED ORDER — SODIUM CHLORIDE 0.9 % IV SOLN
INTRAVENOUS | Status: DC | PRN
  Administered 2019-08-18 (×2): .05 ug/kg/min via INTRAVENOUS

## 2019-08-18 MED ORDER — REMIFENTANIL HCL 1 MG IV SOLR
INTRAVENOUS | Status: AC
  Filled 2019-08-18: qty 1000

## 2019-08-18 MED ORDER — DEXAMETHASONE SODIUM PHOSPHATE 10 MG/ML IJ SOLN
INTRAMUSCULAR | Status: AC
  Filled 2019-08-18: qty 1

## 2019-08-18 MED ORDER — PROPOFOL 10 MG/ML IV BOLUS
INTRAVENOUS | Status: DC | PRN
  Administered 2019-08-18: 160 mg via INTRAVENOUS

## 2019-08-18 MED ORDER — ACETAMINOPHEN 500 MG PO TABS
ORAL_TABLET | ORAL | Status: AC
  Administered 2019-08-18: 1000 mg via ORAL
  Filled 2019-08-18: qty 2

## 2019-08-18 MED ORDER — GABAPENTIN 300 MG PO CAPS: 300.0000 mg | ORAL_CAPSULE | ORAL | Status: AC

## 2019-08-18 MED ORDER — SUCCINYLCHOLINE CHLORIDE 200 MG/10ML IV SOSY
PREFILLED_SYRINGE | INTRAVENOUS | Status: AC
  Filled 2019-08-18: qty 10

## 2019-08-18 MED ORDER — TRAMADOL HCL 50 MG PO TABS
50.0000 mg | ORAL_TABLET | Freq: Four times a day (QID) | ORAL | Status: DC | PRN
  Administered 2019-08-18: 50 mg via ORAL
  Filled 2019-08-18: qty 1

## 2019-08-18 MED ORDER — PHENYLEPHRINE HCL (PRESSORS) 10 MG/ML IV SOLN
INTRAVENOUS | Status: DC | PRN
  Administered 2019-08-18 (×3): 100 ug via INTRAVENOUS

## 2019-08-18 MED ORDER — FENTANYL CITRATE (PF) 100 MCG/2ML IJ SOLN
INTRAMUSCULAR | Status: AC
  Filled 2019-08-18: qty 2

## 2019-08-18 MED ORDER — FAMOTIDINE 20 MG PO TABS: 20.0000 mg | ORAL_TABLET | Freq: Once | ORAL | Status: AC

## 2019-08-18 MED ORDER — SODIUM CHLORIDE 0.9 % IV SOLN
INTRAVENOUS | Status: DC | PRN
  Administered 2019-08-18: 25 ug/min via INTRAVENOUS

## 2019-08-18 MED ORDER — PROPOFOL 500 MG/50ML IV EMUL
INTRAVENOUS | Status: AC
  Filled 2019-08-18: qty 50

## 2019-08-18 MED ORDER — ATORVASTATIN CALCIUM 20 MG PO TABS
40.0000 mg | ORAL_TABLET | Freq: Every day | ORAL | Status: DC
  Administered 2019-08-19: 09:00:00 40 mg via ORAL
  Filled 2019-08-18: qty 2

## 2019-08-18 MED ORDER — LACTATED RINGERS IV SOLN: INTRAVENOUS | Status: DC

## 2019-08-18 MED ORDER — FENTANYL CITRATE (PF) 100 MCG/2ML IJ SOLN: INTRAMUSCULAR | Status: DC | PRN

## 2019-08-18 MED ORDER — OXYCODONE HCL 5 MG PO TABS: 5.0000 mg | ORAL_TABLET | Freq: Once | ORAL | Status: DC | PRN

## 2019-08-18 MED ORDER — CHLORHEXIDINE GLUCONATE CLOTH 2 % EX PADS: 6.0000 | MEDICATED_PAD | Freq: Once | CUTANEOUS | Status: DC

## 2019-08-18 MED ORDER — TART CHERRY ADVANCED PO CAPS: ORAL_CAPSULE | Freq: Every day | ORAL | Status: DC

## 2019-08-18 MED ORDER — FAMOTIDINE 20 MG PO TABS
ORAL_TABLET | ORAL | Status: AC
  Administered 2019-08-18: 20 mg via ORAL
  Filled 2019-08-18: qty 1

## 2019-08-18 MED ORDER — MIDAZOLAM HCL 2 MG/2ML IJ SOLN
INTRAMUSCULAR | Status: DC | PRN
  Administered 2019-08-18: 2 mg via INTRAVENOUS

## 2019-08-18 MED ORDER — SODIUM CHLORIDE (PF) 0.9 % IJ SOLN
INTRAMUSCULAR | Status: AC
  Filled 2019-08-18: qty 40

## 2019-08-18 MED ORDER — COQ10 100 MG PO CAPS: 100.0000 mg | ORAL_CAPSULE | Freq: Every day | ORAL | Status: DC

## 2019-08-18 MED ORDER — CELECOXIB 200 MG PO CAPS: 200.0000 mg | ORAL_CAPSULE | ORAL | Status: AC

## 2019-08-18 MED ORDER — COLCHICINE 0.6 MG PO TABS: 1.2000 mg | ORAL_TABLET | Freq: Every day | ORAL | Status: DC | PRN

## 2019-08-18 MED ORDER — FENTANYL CITRATE (PF) 100 MCG/2ML IJ SOLN
25.0000 ug | INTRAMUSCULAR | Status: DC | PRN
  Administered 2019-08-18 (×4): 25 ug via INTRAVENOUS

## 2019-08-18 MED ORDER — LIDOCAINE HCL (PF) 2 % IJ SOLN
INTRAMUSCULAR | Status: AC
  Filled 2019-08-18: qty 5

## 2019-08-18 MED ORDER — SODIUM CHLORIDE 0.9% FLUSH
3.0000 mL | Freq: Two times a day (BID) | INTRAVENOUS | Status: DC
  Administered 2019-08-18 – 2019-08-19 (×2): 3 mL via INTRAVENOUS

## 2019-08-18 MED ORDER — "VISTASEAL 4 ML SINGLE DOSE KIT "
PACK | CUTANEOUS | Status: DC | PRN
  Administered 2019-08-18 (×2): 4 mL via TOPICAL

## 2019-08-18 MED ORDER — LIDOCAINE HCL (CARDIAC) PF 100 MG/5ML IV SOSY
PREFILLED_SYRINGE | INTRAVENOUS | Status: DC | PRN
  Administered 2019-08-18: 100 mg via INTRAVENOUS

## 2019-08-18 MED ORDER — ACETAMINOPHEN 500 MG PO TABS
1000.0000 mg | ORAL_TABLET | Freq: Four times a day (QID) | ORAL | Status: DC
  Administered 2019-08-18 – 2019-08-19 (×3): 1000 mg via ORAL
  Filled 2019-08-18 (×3): qty 2

## 2019-08-18 MED ORDER — FENTANYL CITRATE (PF) 100 MCG/2ML IJ SOLN
INTRAMUSCULAR | Status: DC | PRN
  Administered 2019-08-18 (×2): 25 ug via INTRAVENOUS

## 2019-08-18 MED ORDER — VITAMIN B-12 1000 MCG PO TABS
1000.0000 ug | ORAL_TABLET | Freq: Every day | ORAL | Status: DC
  Administered 2019-08-18 – 2019-08-19 (×2): 1000 ug via ORAL
  Filled 2019-08-18 (×2): qty 1

## 2019-08-18 MED ORDER — PHENYLEPHRINE HCL (PRESSORS) 10 MG/ML IV SOLN
INTRAVENOUS | Status: AC
  Filled 2019-08-18: qty 1

## 2019-08-18 MED ORDER — GABAPENTIN 300 MG PO CAPS
ORAL_CAPSULE | ORAL | Status: AC
  Administered 2019-08-18: 300 mg via ORAL
  Filled 2019-08-18: qty 1

## 2019-08-18 MED ORDER — SIMETHICONE 80 MG PO CHEW: 40.0000 mg | CHEWABLE_TABLET | Freq: Four times a day (QID) | ORAL | Status: DC | PRN

## 2019-08-18 MED ORDER — ACETAMINOPHEN 500 MG PO TABS: 1000.0000 mg | ORAL_TABLET | ORAL | Status: AC

## 2019-08-18 MED ORDER — OXYCODONE HCL 5 MG/5ML PO SOLN: 5.0000 mg | Freq: Once | ORAL | Status: DC | PRN

## 2019-08-18 MED ORDER — DIPHENOXYLATE-ATROPINE 2.5-0.025 MG PO TABS: 1.0000 | ORAL_TABLET | Freq: Four times a day (QID) | ORAL | Status: DC | PRN

## 2019-08-18 MED ORDER — PROPOFOL 500 MG/50ML IV EMUL
INTRAVENOUS | Status: DC | PRN
  Administered 2019-08-18: 100 ug/kg/min via INTRAVENOUS
  Administered 2019-08-18: 150 ug/kg/min via INTRAVENOUS

## 2019-08-18 MED ORDER — ALLOPURINOL 100 MG PO TABS
300.0000 mg | ORAL_TABLET | Freq: Every day | ORAL | Status: DC
  Administered 2019-08-19: 300 mg via ORAL
  Filled 2019-08-18: qty 3

## 2019-08-18 MED ORDER — EPHEDRINE 5 MG/ML INJ
INTRAVENOUS | Status: AC
  Filled 2019-08-18: qty 10

## 2019-08-18 MED ORDER — CALCIUM CARBONATE-VITAMIN D 500-200 MG-UNIT PO TABS
1.0000 | ORAL_TABLET | Freq: Three times a day (TID) | ORAL | Status: DC
  Administered 2019-08-18 – 2019-08-19 (×2): 1 via ORAL
  Filled 2019-08-18 (×2): qty 1

## 2019-08-18 MED ORDER — EPHEDRINE SULFATE 50 MG/ML IJ SOLN
INTRAMUSCULAR | Status: DC | PRN
  Administered 2019-08-18: 10 mg via INTRAVENOUS

## 2019-08-18 MED ORDER — ONDANSETRON HCL 4 MG/2ML IJ SOLN
INTRAMUSCULAR | Status: DC | PRN
  Administered 2019-08-18: 4 mg via INTRAVENOUS

## 2019-08-18 MED ORDER — HEMOSTATIC AGENTS (NO CHARGE) OPTIME
TOPICAL | Status: DC | PRN
  Administered 2019-08-18 (×2): 1 via TOPICAL

## 2019-08-18 MED ORDER — MENTHOL 3 MG MT LOZG
1.0000 | LOZENGE | OROMUCOSAL | Status: DC | PRN
  Administered 2019-08-19: 3 mg via ORAL
  Filled 2019-08-18 (×2): qty 9

## 2019-08-18 MED ORDER — DEXAMETHASONE SODIUM PHOSPHATE 10 MG/ML IJ SOLN
INTRAMUSCULAR | Status: DC | PRN
  Administered 2019-08-18: 10 mg via INTRAVENOUS

## 2019-08-18 MED ORDER — ONDANSETRON HCL 4 MG/2ML IJ SOLN: 4.0000 mg | Freq: Four times a day (QID) | INTRAMUSCULAR | Status: DC | PRN

## 2019-08-18 MED ORDER — LISINOPRIL 20 MG PO TABS
40.0000 mg | ORAL_TABLET | Freq: Every day | ORAL | Status: DC
  Administered 2019-08-18 – 2019-08-19 (×2): 40 mg via ORAL
  Filled 2019-08-18 (×2): qty 2

## 2019-08-18 MED ORDER — MIDAZOLAM HCL 2 MG/2ML IJ SOLN
INTRAMUSCULAR | Status: AC
  Filled 2019-08-18: qty 2

## 2019-08-18 MED ORDER — SUCCINYLCHOLINE CHLORIDE 20 MG/ML IJ SOLN
INTRAMUSCULAR | Status: DC | PRN
  Administered 2019-08-18: 120 mg via INTRAVENOUS

## 2019-08-18 SURGICAL SUPPLY — 47 items
BACTOSHIELD CHG 4% 4OZ (MISCELLANEOUS) ×1
BASIN GRAD PLASTIC 32OZ STRL (MISCELLANEOUS) ×2 IMPLANT
BLADE SURG 15 STRL LF DISP TIS (BLADE) ×1 IMPLANT
BLADE SURG 15 STRL SS (BLADE) ×1
CANISTER SUCT 1200ML W/VALVE (MISCELLANEOUS) ×2 IMPLANT
CLIP VESOCCLUDE SM WIDE 6/CT (CLIP) ×2 IMPLANT
COVER LIGHT HANDLE STERIS (MISCELLANEOUS) ×1 IMPLANT
COVER WAND RF STERILE (DRAPES) ×2 IMPLANT
DERMABOND ADVANCED (GAUZE/BANDAGES/DRESSINGS) ×1
DERMABOND ADVANCED .7 DNX12 (GAUZE/BANDAGES/DRESSINGS) ×1 IMPLANT
DRAPE MAG INST 16X20 L/F (DRAPES) ×2 IMPLANT
DRAPE THYROID T SHEET (DRAPES) ×2 IMPLANT
ELECT BLADE 6.5 EXT (BLADE) ×1 IMPLANT
ELECT CAUTERY BLADE TIP 2.5 (TIP) ×2
ELECT LARYNGEAL DUAL CHAN (ELECTRODE) ×1 IMPLANT
ELECT NEEDLE 20X.3 GREEN (MISCELLANEOUS) ×2
ELECT REM PT RETURN 9FT ADLT (ELECTROSURGICAL) ×2
ELECTRODE CAUTERY BLDE TIP 2.5 (TIP) ×1 IMPLANT
ELECTRODE NDL 20X.3 GREEN (MISCELLANEOUS) IMPLANT
ELECTRODE NEEDLE 20X.3 GREEN (MISCELLANEOUS) ×1 IMPLANT
ELECTRODE REM PT RTRN 9FT ADLT (ELECTROSURGICAL) ×1 IMPLANT
GAUZE 4X4 16PLY RFD (DISPOSABLE) ×2 IMPLANT
GLOVE BIO SURGEON STRL SZ 6.5 (GLOVE) ×4 IMPLANT
GLOVE INDICATOR 7.0 STRL GRN (GLOVE) ×2 IMPLANT
GOWN STRL REUS W/ TWL LRG LVL3 (GOWN DISPOSABLE) ×2 IMPLANT
GOWN STRL REUS W/TWL LRG LVL3 (GOWN DISPOSABLE) ×2
HEMOSTAT SNOW SURGICEL 2X4 (HEMOSTASIS) ×3 IMPLANT
KIT TURNOVER KIT A (KITS) ×2 IMPLANT
LABEL OR SOLS (LABEL) ×2 IMPLANT
NERVE STIMULATOR WAVEFORM 16S (NEUROSURGERY SUPPLIES) ×2
NS IRRIG 500ML POUR BTL (IV SOLUTION) ×2 IMPLANT
PACK BASIN MINOR (MISCELLANEOUS) ×2 IMPLANT
PROBE NEUROSIGN BIPOL (MISCELLANEOUS) IMPLANT
PROBE NEUROSIGN BIPOLAR (MISCELLANEOUS) ×1
SCRUB CHG 4% DYNA-HEX 4OZ (MISCELLANEOUS) ×1 IMPLANT
SHEARS HARMONIC 9CM CVD (BLADE) ×1 IMPLANT
SPONGE KITTNER 5P (MISCELLANEOUS) ×3 IMPLANT
STIMULATOR NERVE WAVEFORM 16S (NEUROSURGERY SUPPLIES) IMPLANT
STRIP CLOSURE SKIN 1/2X4 (GAUZE/BANDAGES/DRESSINGS) ×2 IMPLANT
SUT MNCRL AB 4-0 PS2 18 (SUTURE) IMPLANT
SUT PROLENE 4 0 PS 2 18 (SUTURE) ×2 IMPLANT
SUT SILK 2 0 (SUTURE) ×3
SUT SILK 2-0 18XBRD TIE 12 (SUTURE) ×1 IMPLANT
SUT VIC AB 4-0 RB1 27 (SUTURE) ×1
SUT VIC AB 4-0 RB1 27X BRD (SUTURE) ×1 IMPLANT
SYR BULB IRRIG 60ML STRL (SYRINGE) ×2 IMPLANT
TUBING CONNECTING 10 (TUBING) ×1 IMPLANT
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
## 2019-08-18 NOTE — Interval H&P Note (Signed)
History and Physical Interval Note:  08/18/2019 9:43 AM  Jeremy Atkinson  has presented today for surgery, with the diagnosis of Thyroiditis, chronic lympatic neck lipoma.  The various methods of treatment have been discussed with the patient and family. After consideration of risks, benefits and other options for treatment, the patient has consented to  Procedure(s): Total THYROIDECTOMY (N/A) Lipoma REMOVAL NECK anterior (N/A) as a surgical intervention.  The patient's history has been reviewed, patient examined, no change in status, stable for surgery.  I have reviewed the patient's chart and labs.  Questions were answered to the patient's satisfaction.     Fredirick Maudlin

## 2019-08-18 NOTE — Op Note (Signed)
Operative Note  Preoperative Diagnosis:  Hashimoto's thyroiditis; neck lipoma  Postoperative Diagnosis:  Hashimoto's thyroiditis; subfascial neck lipoma  Operation:  Total Thyroidectomy with Cervical Extraction of Substernal Component  Surgeon: Fredirick Maudlin, MD  Assistant: Nestor Lewandowsky, MD (a second surgeon was necessary for exposure and due to the significant technical complexity of the case)  Anesthesia: General endotracheal with nerve monitoring system  Findings: The neck lipoma was deep to the platysma muscle and intimately involved the external jugular vein.  There was no evidence of invasion.  It measured 5.5 x 5.5 cm.  The thyroid was significantly enlarged and had a texture consistent with the preoperative diagnosis of Hashimoto's thyroiditis, namely it was quite fibrous.  Both recurrent laryngeal nerves were identified and gave excellent functional signals throughout the operation.  The right superior parathyroid gland was devascularized and inadvertently sucked up through the suction tubing before it could be preserved for autotransplantation.  The remaining 3 parathyroid glands were identified and preserved with good vascular pedicles.  The thyroid did extend beneath the sternum and clavicles, predominantly on the right.  Indications: [name] who has had a longstanding history of difficult to control hypothyroidism.  He has had significant fluctuations in his levels, despite appropriate administration of his thyroid hormone replacement.  He was referred to me for surgical evaluation.  Subsequent work-up was consistent with Hashimoto's thyroiditis with TPO antibodies greater than 600.  He also had a neck lipoma that he desired to have removed during the same operation.  I discussed the risks of surgery with the patient and he agreed to proceed.  Procedure In Detail: The patient was identified in the preoperative holding area and brought to the operating room where he  was placed supine on the OR table.  All bony prominences were padded and bilateral sequential compression devices were placed on the lower extremities.  General endotracheal anesthesia was induced using the nerve monitoring system.  Tube placement was verified with the McGrath laryngoscope.  The grounding lead was placed and the patient was positioned appropriately for the operation.  He was sterilely prepped and draped in standard fashion.  A timeout was performed confirming the patient's identity, the procedure being performed, his allergies, all necessary equipment was available, and the maintenance anesthesia was adequate.  A 5 cm transverse incision was made in a natural skin fold that was appropriately positioned for the operation.  This was carried down through the subcutaneous tissues and platysma using electrocautery.  We encountered the most medial edge of the lipoma, just lateral to the sternohyoid muscle.  We used a combination of blunt dissection and electrocautery to gently dissect it away from the sternothyroid and sternohyoid muscle. It was found to be closely adherent to the external jugular vein.  Rather than risk avulsing the vein and causing significant bleeding, I elected to resect the vein along with the lipoma.  The vein was doubly ligated and divided on each end, both cranial and caudal to the mass.  The lipoma was then completely circumferentially dissected away from all of the other tissues and was able to be removed.  It was measured and was found to be 5.5 cm in greatest dimension.  It was handed off as a specimen.  We then turned our attention to the thyroidectomy.  We elevated subplatysmal flaps.  The strap muscles were divided in the median raphe.  We elevated the strap muscles off of the left lobe of the thyroid and retracted them laterally.  We sequentially isolated and divided the superior pole vessels with silk ties and the harmonic scalpel.  The superior parathyroid gland was  identified and preserved on a good pedicle.  We then rotated the thyroid medially.  This was somewhat challenging secondary to a large, fibrotic tubercle of Zuckerkandl.  Once we were able to mobilize that adequately, we divided the middle thyroid vein.  We then opened the tracheoesophageal groove and identified the recurrent laryngeal nerve.  It gave good signal when stimulated.  The trachea was exposed medial to the nerve.  The inferior parathyroid gland was dissected off of the capsule of the thyroid and preserved on a good pedicle.  The inferior pole vessels were then divided.  There was a small substernal component to this lobe which was elevated out from under the clavicle.  The nerve was dissected up to its insertion point at the cricopharyngeal muscle.  The ligament of Gwenlyn Found was divided.  The gland was dissected off the trachea and across the midline.  There was a small pyramidal lobe that was dissected away from the underlying musculature and divided at its most superior aspect.  We then turned our attention to the right.  The strap muscles were elevated off the lobe.  There was a large middle thyroid vein that avulsed during this process.  We lost a small amount of blood will be regained control of the vessel.  Were then able to tie it and continue the operation.  The superior pole vessels were sequentially isolated and divided.  We did not initially appreciate the superior parathyroid gland.  We continued to rotate the thyroid medially.  This lobe extended a bit further beneath the clavicles and sternum making manipulation somewhat more challenging.  We were able to carefully tease away the overlying musculature.  The tubercle of Zuckerkandl wrapped around posterior to the trachea and so it was gently manipulated until it could be rotated more medially to expose the proposed site of the recurrent laryngeal nerve.  I then dissected into the lateral fibrofatty tissues of the central neck until I was able  to identify the nerve.  It gave a good signal when stimulated.  The trachea was exposed medial to the nerve and the inferior pole vessels were divided.  The inferior parathyroid gland was identified and preserved on a good pedicle.  Just posterior to the large tubercle of Zuckerkandl, I identified a devascularized parathyroid gland.  As I attempted to retrieve it for possible autotransplantation, the suction was utilized to suck away some oozing and unfortunately, the gland was sucked up into the suction tubing and canister.  We then continued to dissect the nerve up to its insertion point at the cricopharyngeal muscle.  The remaining attachments of the thyroid to the trachea were divided, and the gland was completely excised.  It was carefully inspected for any additional parathyroid tissue.  None was seen.  It was handed off as a specimen.  We irrigated our wound beds.  Valsalva maneuvers from the anesthesia team identified several small bleeding vessels which were subsequently ligated.  We then performed additional Valsalva maneuvers and confirmed that there was no ongoing surgical bleeding.  We applied SNoW and Vistaseal to the wound bed for additional hemostasis.  The strap muscles were closed in the midline with running 4-0 Vicryl.  The platysma was closed with interrupted Vicryl.  The skin was closed with running subcuticular Prolene.  The skin was cleaned.  Dermabond and Steri-Strips were applied.  The Prolene  suture was removed.  The patient was awakened, extubated, and taken to the postanesthesia care unit in good condition.  EBL: 50 cc  IVF: See anesthesia record  Specimen(s): 1.  Neck mass; 2.  Total thyroid  Complications: none immediately apparent.   Counts: all needles, instruments, and sponges were counted and reported to be correct in number at the end of the case.   I was present for and participated in the entire operation.  Fredirick Maudlin 2:08 PM

## 2019-08-18 NOTE — Transfer of Care (Signed)
Immediate Anesthesia Transfer of Care Note  Patient: Jeremy Atkinson  Procedure(s) Performed: Total THYROIDECTOMY (N/A Neck) Lipoma REMOVAL NECK anterior (N/A Neck)  Patient Location: PACU  Anesthesia Type:General  Level of Consciousness: drowsy  Airway & Oxygen Therapy: Patient connected to nasal cannula oxygen  Post-op Assessment: Report given to RN  Post vital signs: stable  Last Vitals:  Vitals Value Taken Time  BP 169/77 08/18/19 1406  Temp 36.3 C 08/18/19 1406  Pulse 84 08/18/19 1410  Resp 17 08/18/19 1410  SpO2 97 % 08/18/19 1410  Vitals shown include unvalidated device data.  Last Pain:  Vitals:   08/18/19 1406  TempSrc:   PainSc: Asleep         Complications: No apparent anesthesia complications

## 2019-08-18 NOTE — Anesthesia Procedure Notes (Signed)
Procedure Name: Intubation Date/Time: 08/18/2019 10:10 AM Performed by: Zetta Bills, CRNA Pre-anesthesia Checklist: Patient identified, Emergency Drugs available, Suction available and Patient being monitored Patient Re-evaluated:Patient Re-evaluated prior to induction Oxygen Delivery Method: Circle system utilized Preoxygenation: Pre-oxygenation with 100% oxygen Induction Type: IV induction Ventilation: Mask ventilation without difficulty Laryngoscope Size: McGraph and 4 Grade View: Grade I Tube type: Oral Tube size: 7.5 mm Number of attempts: 1 Airway Equipment and Method: Stylet and Video-laryngoscopy Placement Confirmation: ETT inserted through vocal cords under direct vision,  positive ETCO2 and CO2 detector Secured at: 21 cm Tube secured with: Tape Dental Injury: Teeth and Oropharynx as per pre-operative assessment

## 2019-08-19 DIAGNOSIS — D170 Benign lipomatous neoplasm of skin and subcutaneous tissue of head, face and neck: Secondary | ICD-10-CM | POA: Diagnosis not present

## 2019-08-19 DIAGNOSIS — E069 Thyroiditis, unspecified: Secondary | ICD-10-CM | POA: Diagnosis not present

## 2019-08-19 DIAGNOSIS — R221 Localized swelling, mass and lump, neck: Secondary | ICD-10-CM | POA: Diagnosis not present

## 2019-08-19 LAB — CALCIUM: Calcium: 8.8 mg/dL — ABNORMAL LOW (ref 8.9–10.3)

## 2019-08-19 LAB — ALBUMIN: Albumin: 3.6 g/dL (ref 3.5–5.0)

## 2019-08-19 MED ORDER — HYDROCODONE-ACETAMINOPHEN 5-325 MG PO TABS
1.0000 | ORAL_TABLET | Freq: Four times a day (QID) | ORAL | 0 refills | Status: AC | PRN
Start: 2019-08-19 — End: ?

## 2019-08-19 MED ORDER — LEVOTHYROXINE SODIUM 150 MCG PO TABS: 150.0000 ug | ORAL_TABLET | Freq: Every day | ORAL | 3 refills | Status: AC

## 2019-08-19 MED ORDER — IBUPROFEN 600 MG PO TABS: 600.0000 mg | ORAL_TABLET | Freq: Four times a day (QID) | ORAL | 0 refills | Status: AC | PRN

## 2019-08-19 MED ORDER — CALCIUM CARBONATE-VITAMIN D 500-200 MG-UNIT PO TABS: 2.0000 | ORAL_TABLET | Freq: Three times a day (TID) | ORAL | 1 refills | Status: AC

## 2019-08-19 NOTE — Care Management Obs Status (Signed)
MEDICARE OBSERVATION STATUS NOTIFICATION   Patient Details  Name: Jeremy Atkinson MRN: [id number] Date of Birth: 1951-01-31   Medicare Observation Status Notification Given:  No(admitted obs less than 24 hours)    Beverly Sessions, RN 08/19/2019, 10:37 AM
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
## 2019-08-19 NOTE — Discharge Instructions (Signed)

## 2019-08-19 NOTE — Anesthesia Postprocedure Evaluation (Signed)
Anesthesia Post Note  Patient: Jeremy Atkinson  Procedure(s) Performed: Total THYROIDECTOMY (N/A Neck) Lipoma REMOVAL NECK anterior (N/A Neck)  Patient location during evaluation: PACU Anesthesia Type: General Level of consciousness: awake and alert Pain management: pain level controlled Vital Signs Assessment: post-procedure vital signs reviewed and stable Respiratory status: spontaneous breathing, nonlabored ventilation, respiratory function stable and patient connected to nasal cannula oxygen Cardiovascular status: blood pressure returned to baseline and stable Postop Assessment: no apparent nausea or vomiting Anesthetic complications: no     Last Vitals:  Vitals:   08/19/19 0517 08/19/19 0907  BP: (!) 110/55 129/66  Pulse: 66   Resp: 18   Temp: 36.8 C   SpO2: 94%     Last Pain:  Vitals:   08/19/19 0820  TempSrc:   PainSc: 0-No pain                 Arita Miss

## 2019-08-22 ENCOUNTER — Telehealth: Payer: Self-pay | Admitting: *Deleted

## 2019-08-22 LAB — SURGICAL PATHOLOGY

## 2019-08-22 NOTE — Telephone Encounter (Signed)
Spoke with Dr Celine Ahr and the patient is to make sure he is taking him Ibuprofen every 6 hours scheduled and also to be sure he is using the ice pack on every two hours for 20 minutes. He is amendable to this and will call back if he does not get better or if his symptoms worsen.

## 2019-08-22 NOTE — Telephone Encounter (Signed)
Had surgery on Monday by Dr Celine Ahr total thyroidectomy, he is complaining of his neck on both side is swollen, he is in a lot of pain. He has been taking his pain medication which is not helping. He stated that he can not sleep or get comfortable. Please call and advise

## 2019-08-25 ENCOUNTER — Other Ambulatory Visit: Payer: Self-pay | Admitting: General Surgery

## 2019-08-25 NOTE — Telephone Encounter (Signed)
Per Dr.Cannon denied patient refill on narcotics. She advised patient to take Ibuprofen 600 mg for the first three hours and then the next three hours to take Tylenol 650 mg and continue to alternate between the two every six hours. Along with advising patient to use cold compress to the area. Patient verbalized understanding and has no further questions.

## 2019-08-25 NOTE — Telephone Encounter (Signed)
Patient is calling and is asking if he can get a refill on his oxycodone . Please call patient and advise.

## 2019-09-02 ENCOUNTER — Encounter: Payer: Self-pay | Admitting: General Surgery

## 2019-09-02 ENCOUNTER — Other Ambulatory Visit: Payer: Self-pay

## 2019-09-02 ENCOUNTER — Ambulatory Visit (INDEPENDENT_AMBULATORY_CARE_PROVIDER_SITE_OTHER): Payer: Self-pay | Admitting: General Surgery

## 2019-09-02 DIAGNOSIS — E890 Postprocedural hypothyroidism: Secondary | ICD-10-CM

## 2019-09-02 DIAGNOSIS — E069 Thyroiditis, unspecified: Secondary | ICD-10-CM

## 2019-09-02 NOTE — Progress Notes (Signed)
Mr. Ellender is here today for a postoperative visit.  He underwent a total thyroidectomy and removal of a very deep cervical lipoma on Aug 18, 2019.  Final pathology demonstrated:  SURGICAL PATHOLOGY  CASE: [id number]  PATIENT: [facility]  Surgical Pathology Report      Specimen Submitted:  A. Neck mass, anterior  B. Thyroid   Clinical History: Thyroiditis, chronic lymphatic neck lipoma       DIAGNOSIS:  A. NECK MASS, ANTERIOR; EXCISION:  - BENIGN ADIPOSE TISSUE WITHOUT ATYPIA, CONSISTENT WITH LIPOMA.  - NEGATIVE FOR MALIGNANCY.   B. THYROID; TOTAL THYROIDECTOMY:  - LYMPHOCYTIC THYROIDITIS WITH DENSE FIBROSIS.  - NEGATIVE FOR MALIGNANCY.   He has done fairly well in the postoperative period.  He has contacted our office on a couple of occasions due to swelling and discomfort.  He says that the neck discomfort has mostly resolved, but he continues to endorse headaches.  His voice and swallowing are normal.  He denies any paresthesias or symptoms of hypocalcemia.  He has been taking 1 tablet of Os-Cal 3 times daily.  His current thyroid hormone replacement is 150 mcg daily.  Today's Vitals   09/02/19 1015  BP: (!) 160/83  Pulse: 81  Temp: (!) 96.1 F (35.6 C)  TempSrc: Temporal  SpO2: 94%  Weight: 226 lb 6.4 oz (102.7 kg)  Height: 6' (1.829 m)  PainSc: 7   PainLoc: Throat   Body mass index is 30.71 kg/m. Focused neck examination demonstrates that his Steri-Strips are still in place.  These were removed to reveal a well approximated transverse thyroidectomy incision.  There is no erythema, induration, or drainage.  There is an expected amount of normal postoperative swelling.  There is also a fairly thick healing ridge beneath the surface.  Impression and plan: [name] who underwent a total thyroidectomy for chronic lymphocytic thyroiditis.  He also had a very large submuscular lipoma removed at the same time.  Overall, he is doing well.  The  swelling is what I would expect at this point after his operation.  He was instructed in scar care, including massage with a topical emollient agent to help soften, flatten, and help the scar to fade.  I also gave him range of motion exercises for his neck, as it is not uncommon for patients to get headaches after thyroid surgery because of conscious or unconscious splinting.  He may also use a heating pad on his neck and upper back muscles, or even consider a massage.  He may discontinue his calcium supplementation.  We will check thyroid labs in 6 weeks and make any necessary adjustments to his levothyroxine dosage at that time.  I will see him in the clinic on an as-needed basis.

## 2019-09-02 NOTE — Patient Instructions (Addendum)
Start massaging the area on the incision with vitamin e oil or coco-butter to help relieve swelling. You may do this 4-5 times a day.   Begin doing exercises to help with headaches. Turn your head left and right, up and down, side to side.   You may shower and use a warm rag to take the rest of the glue off.   You may stop taking the Calcium.   You will need to have some lab work completed in 6 weeks (around July 6). You can get these done at the [facility] at the Lab. No appointment required. Dr Celine Ahr will call you with these results.  Follow up as needed. Call the office if you have any questions.   Thyroidectomy, Care After This sheet gives you information about how to care for yourself after your procedure. Your health care provider may also give you more specific instructions. If you have problems or questions, contact your health care provider. What can I expect after the procedure? After the procedure, it is common to have:  Mild pain in the neck or upper body, especially when swallowing.  A swollen neck.  A sore throat.  A weak or hoarse voice.  Slight tingling or numbness around your mouth, or in your fingers or toes. This may last for a day or two after surgery. This condition is caused by low levels of calcium. You may be given calcium supplements to treat it. Follow these instructions at home:  Medicines  Take over-the-counter and prescription medicines only as told by your health care provider.  Do not drive or use heavy machinery while taking prescription pain medicine.  Do not take medicines that contain aspirin and ibuprofen until your health care provider says that you can. These medicines can increase your risk of bleeding.  Take a thyroid hormone medicine as recommended by your health care provider. You will have to take this medicine for the rest of your life if your entire thyroid was removed. Eating and drinking  Start slowly with  eating. You may need to have only liquids and soft foods for a few days or as directed by your health care provider.  To prevent or treat constipation while you are taking prescription pain medicine, your health care provider may recommend that you: ? Drink enough fluid to keep your urine pale yellow. ? Take over-the-counter or prescription medicines. ? Eat foods that are high in fiber, such as fresh fruits and vegetables, whole grains, and beans. ? Limit foods that are high in fat and processed sugars, such as fried and sweet foods. Incision care  Follow instructions from your health care provider about how to take care of your incision. Make sure you: ? Wash your hands with soap and water before you change your bandage (dressing). If soap and water are not available, use hand sanitizer. ? Change your dressing as told by your health care provider. ? Leave stitches (sutures), skin glue, or adhesive strips in place. These skin closures may need to stay in place for 2 weeks or longer. If adhesive strip edges start to loosen and curl up, you may trim the loose edges. Do not remove adhesive strips completely unless your health care provider tells you to do that.  Check your incision area every day for signs of infection. Check for: ? Redness, swelling, or pain. ? Fluid or blood. ? Warmth. ? Pus or a bad smell.  Do not take baths, swim, or use a hot  tub until your health care provider approves. Activity  For the first 10 days after the procedure or as instructed by your health care provider: ? Do not lift anything that is heavier than 10 lb (4.5 kg). ? Do not jog, swim, or do other strenuous exercises. ? Do not play contact sports.  Avoid sitting for a long time without moving. Get up to take short walks every 1-2 hours. This is needed to improve blood flow and breathing. Ask for help if you feel weak or unsteady.  Return to your normal activities as told by your health care provider. Ask  your health care provider what activities are safe for you. General instructions  Do not use any products that contain nicotine or tobacco, such as cigarettes and e-cigarettes. These can delay healing after surgery. If you need help quitting, ask your health care provider.  Keep all follow-up visits as told by your health care provider. This is important. Your health care provider needs to monitor the calcium level in your blood to make sure that it does not become low. Contact a health care provider if you:  Have a fever.  Have more redness, swelling, or pain around your incision area.  Have fluid or blood coming from your incision area.  Notice that your incision area feels warm to the touch.  Have pus or a bad smell coming from your incision area.  Have trouble talking.  Have nausea or vomiting for more than 2 days. Get help right away if you:  Have trouble breathing.  Have trouble swallowing.  Develop a rash.  Develop a cough that gets worse.  Notice that your speech changes, or you have hoarseness that gets worse.  Develop numbness, tingling, or muscle spasms in the arms, hands, feet, or face. Summary  After the procedure, it is common to feel mild pain in the neck or upper body, especially when swallowing.  Take medicines as told by your health care provider. These include pain medicines and thyroid hormones, if required.  Follow instructions from your health care provider about how to take care of your incision. Watch for signs of infection.  Keep all follow-up visits as told by your health care provider. This is important. Your health care provider needs to monitor the calcium level in your blood to make sure that it does not become low.  Get help right away if you develop difficulty breathing, or numbness, tingling, or muscle spasms in the arms, hands, feet, or face. This information is not intended to replace advice given to you by your health care provider.  Make sure you discuss any questions you have with your health care provider. Document Revised: 05/23/2018 Document Reviewed: 01/30/2017 Elsevier Patient Education  2020 [employer].

## 2019-10-14 ENCOUNTER — Other Ambulatory Visit: Payer: Self-pay

## 2019-10-14 ENCOUNTER — Telehealth: Payer: Self-pay | Admitting: General Surgery

## 2019-10-14 ENCOUNTER — Other Ambulatory Visit
Admission: RE | Admit: 2019-10-14 | Discharge: 2019-10-14 | Disposition: A | Discharge status: Home / Self Care | Payer: Medicare PPO | Attending: General Surgery | Admitting: General Surgery

## 2019-10-14 DIAGNOSIS — E890 Postprocedural hypothyroidism: Secondary | ICD-10-CM | POA: Diagnosis present

## 2019-10-14 LAB — TSH: TSH: 0.087 u[IU]/mL — ABNORMAL LOW (ref 0.350–4.500)

## 2019-10-14 LAB — T4, FREE: Free T4: 1.26 ng/dL — ABNORMAL HIGH (ref 0.61–1.12)

## 2019-10-14 NOTE — Telephone Encounter (Signed)
I discussed the results of Jeremy Atkinson's thyroid function testing with him.  He is on just a slightly too high dose of levothyroxine.  His current dose is 150 mcg daily.  I have recommended that he continue to use the 150 mcg tablets, taking a full tablet Monday through Saturday, and 1/2 tablet on Sunday.  This averages out to 139 mcg daily.  He told me that he has 137 mcg tablets at home and once he has used up his supply of 150 mcg tablets, he may switch to the 137 mcg dose.  We will recheck his labs in 6 weeks.  If he needs any refills before then, he should contact my office.

## 2019-10-15 LAB — T3, FREE: T3, Free: 3.7 pg/mL (ref 2.0–4.4)

## 2019-10-15 LAB — THYROID PEROXIDASE ANTIBODY: Thyroperoxidase Ab SerPl-aCnc: 70 IU/mL — ABNORMAL HIGH (ref 0–34)

## 2019-11-24 ENCOUNTER — Other Ambulatory Visit: Payer: Self-pay

## 2019-11-24 ENCOUNTER — Telehealth: Payer: Self-pay

## 2019-11-24 DIAGNOSIS — E890 Postprocedural hypothyroidism: Secondary | ICD-10-CM

## 2019-11-24 NOTE — Telephone Encounter (Signed)
Patient notified that it is 6 weeks since his last medication adjustment and Dr Celine Ahr would like for him to have repeat labs completed. He states that he will get these done tomorrow morning.

## 2019-11-24 NOTE — Telephone Encounter (Signed)
-----   Message from Lesly Rubenstein, LPN sent at 12/12/5036  1:01 PM EDT ----- Regarding: labs Jeremy Atkinson needs to have a TSH, a Free T4, a Free T3, and thyroid peroxidase antibodies drawn again in 6 weeks to check if the dose adjustment I made today was appropriate.

## 2019-11-25 ENCOUNTER — Other Ambulatory Visit
Admission: RE | Admit: 2019-11-25 | Discharge: 2019-11-25 | Disposition: A | Discharge status: Home / Self Care | Payer: Medicare PPO | Attending: General Surgery | Admitting: General Surgery

## 2019-11-25 ENCOUNTER — Other Ambulatory Visit: Payer: Self-pay

## 2019-11-25 DIAGNOSIS — E890 Postprocedural hypothyroidism: Secondary | ICD-10-CM | POA: Insufficient documentation

## 2019-11-25 LAB — TSH: TSH: 0.147 u[IU]/mL — ABNORMAL LOW (ref 0.350–4.500)

## 2019-11-25 LAB — T4, FREE: Free T4: 0.95 ng/dL (ref 0.61–1.12)

## 2019-11-26 ENCOUNTER — Telehealth: Payer: Self-pay | Admitting: General Surgery

## 2019-11-26 LAB — THYROID PEROXIDASE ANTIBODY: Thyroperoxidase Ab SerPl-aCnc: 74 IU/mL — ABNORMAL HIGH (ref 0–34)

## 2019-11-26 LAB — T3, FREE: T3, Free: 3.1 pg/mL (ref 2.0–4.4)

## 2019-11-26 NOTE — Telephone Encounter (Signed)
Pt called in an attempt to follow up on lab results so that he can adjust his thyroid medication as needed.  He is aware the results arrived early this morning & Dr. Celine Ahr is in the OR, however once reviewed he will be contacted/notifed.  The pt will await the call from either Dr. Celine Ahr or a member of the clinical team.  Thank you

## 2019-11-26 NOTE — Telephone Encounter (Signed)
Discussed TFT results with patient.  Even though the TSH is a hair lower than desired, all other parameters look good and he feels good on the current dose.  No changes made.  He will continue to use the 150 mcg tablets that he has on hand, taking 1 full tablet Monday-Saturday and take only 1/2 tablet on Sunday. Once he runs out of 150 mcg tablets, he will start taking 137 mcg daily. He states he has a full bottle of 137 mcg tablets and does not need a refill at this time. He will contact my office when he needs more medication.

## 2020-01-26 ENCOUNTER — Telehealth: Payer: Self-pay

## 2020-01-26 NOTE — Telephone Encounter (Signed)
Patient called office at this time-he states he had his physical at the [facility] hospital and they advised him to drop his  dosage back to 125 mcg- Levothyroxine (synthyroid) patient states you told him to stay at 137 mcg- he wants your opinion. Patient would like for you to call him at your convenience. Lab results level-TSH -0.22 -Free T4-1.06. Patient is  adamant with staying on 137 since you have him regulated. Please advise.

## 2020-01-27 NOTE — Telephone Encounter (Signed)
I spoke to Jeremy Atkinson about his thyroid levels.  Although the TSH is lower than the normal range, his free T4 is absolutely normal.  He states that he feels well without any symptoms of hyper or hypothyroidism.  I recommended that he continue to take the 137 mcg dose, keeping in mind that his TSH will likely be suppressed on this.  I would only consider changing it if his free T4 becomes elevated or he develops symptoms of hyperthyroidism.

## 2020-02-16 ENCOUNTER — Other Ambulatory Visit
Admission: RE | Admit: 2020-02-16 | Discharge: 2020-02-16 | Disposition: A | Discharge status: Home / Self Care | Payer: Medicare PPO | Source: Ambulatory Visit | Attending: Family Medicine | Admitting: Family Medicine

## 2020-02-16 DIAGNOSIS — R002 Palpitations: Secondary | ICD-10-CM | POA: Insufficient documentation

## 2020-02-16 DIAGNOSIS — R0789 Other chest pain: Secondary | ICD-10-CM | POA: Diagnosis present

## 2020-02-16 LAB — TROPONIN I (HIGH SENSITIVITY): Troponin I (High Sensitivity): 8 ng/L (ref <18)

## 2020-06-04 ENCOUNTER — Other Ambulatory Visit: Payer: Self-pay | Admitting: Family Medicine

## 2020-06-04 DIAGNOSIS — Z136 Encounter for screening for cardiovascular disorders: Secondary | ICD-10-CM

## 2020-06-11 ENCOUNTER — Ambulatory Visit: Payer: Medicare PPO

## 2020-06-17 ENCOUNTER — Ambulatory Visit
Admission: RE | Admit: 2020-06-17 | Discharge: 2020-06-17 | Disposition: A | Discharge status: Home / Self Care | Payer: Medicare PPO | Source: Ambulatory Visit | Attending: Family Medicine | Admitting: Family Medicine

## 2020-06-17 ENCOUNTER — Other Ambulatory Visit: Payer: Self-pay

## 2020-06-17 DIAGNOSIS — Z136 Encounter for screening for cardiovascular disorders: Secondary | ICD-10-CM | POA: Insufficient documentation

## 2020-08-04 ENCOUNTER — Ambulatory Visit: Payer: Medicare PPO | Admitting: Dermatology

## 2020-08-04 ENCOUNTER — Other Ambulatory Visit: Payer: Self-pay

## 2020-08-04 DIAGNOSIS — L718 Other rosacea: Secondary | ICD-10-CM | POA: Diagnosis not present

## 2020-08-04 DIAGNOSIS — L711 Rhinophyma: Secondary | ICD-10-CM

## 2020-08-04 DIAGNOSIS — L219 Seborrheic dermatitis, unspecified: Secondary | ICD-10-CM | POA: Diagnosis not present

## 2020-08-04 DIAGNOSIS — L719 Rosacea, unspecified: Secondary | ICD-10-CM

## 2020-08-04 DIAGNOSIS — L578 Other skin changes due to chronic exposure to nonionizing radiation: Secondary | ICD-10-CM

## 2020-08-04 MED ORDER — HYDROCORTISONE 2.5 % EX LOTN: TOPICAL_LOTION | CUTANEOUS | 2 refills | Status: AC

## 2020-08-04 MED ORDER — KETOCONAZOLE 2 % EX CREA: TOPICAL_CREAM | CUTANEOUS | 2 refills | Status: AC

## 2020-08-04 NOTE — Progress Notes (Signed)
   New Patient Visit  Subjective  Jeremy Atkinson is a 70 y.o. male who presents for the following: breaking out on the face (He is currently using Neutrogena and Aveeno skin care products, but continues to break out and he would like to discuss treatment options today).  The following portions of the chart were reviewed this encounter and updated as appropriate:   Tobacco  Allergies  Meds  Problems  Med Hx  Surg Hx  Fam Hx     Review of Systems:  No other skin or systemic complaints except as noted in HPI or Assessment and Plan.  Objective  Well appearing patient in no apparent distress; mood and affect are within normal limits.  A focused examination was performed including the face. Relevant physical exam findings are noted in the Assessment and Plan.  Objective  Face: Dilated blood vessels with thickening of the skin on the nose.  Objective  Face: Pink patches with greasy scale.   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 It is treatable but not curable. It sometimes affects the eyes (ocular rosacea) as well. It may respond to topical and/or systemic medication and can flare with stress, sun exposure, alcohol, exercise and some foods.  Daily application of broad spectrum spf 30+ sunscreen to face is recommended to reduce flares.  Discussed BBL laser therapy for redness.   Seborrheic dermatitis Face Seborrheic Dermatitis  -  is a chronic persistent rash characterized by pinkness and scaling most commonly of the mid face but also can occur on the scalp (dandruff), ears; mid chest and mid back. It tends to be exacerbated by stress and cooler weather.  People who have neurologic disease may experience new onset or exacerbation of existing seborrheic dermatitis.  The condition is not curable but treatable and can  be controlled.  Start Ketoconazole 2% cream M,W,F alternating with  HC 2.5% lotion on T, Th, Sat.   ketoconazole (NIZORAL) 2 % cream - Face hydrocortisone 2.5 % lotion - Face   Actinic Damage - chronic, secondary to cumulative UV radiation exposure/sun exposure over time - diffuse scaly erythematous macules with underlying dyspigmentation - Recommend daily broad spectrum sunscreen SPF 30+ to sun-exposed areas, reapply every 2 hours as needed.  - Recommend staying in the shade or wearing long sleeves, sun glasses (UVA+UVB protection) and wide brim hats (4-inch brim around the entire circumference of the hat). - Call for new or changing lesions.  Return in about 3 months (around 11/03/2020) for rosacea and seb. derm follow up .  Luther Redo, CMA, am acting as scribe for Sarina Ser, MD .  Documentation: I have reviewed the above documentation for accuracy and completeness, and I agree with the above.  Sarina Ser, MD

## 2020-08-04 NOTE — Patient Instructions (Signed)
If you have any questions or concerns for your doctor, please call our main line at [phone] and press option 4 to reach your doctor's medical assistant. If no one answers, please leave a voicemail as directed and we will return your call as soon as possible. Messages left after 4 pm will be answered the following business day.   You may also send [place] a message via [place]. We typically respond to MyChart messages within 1-2 business days.  For prescription refills, please ask your pharmacy to contact our office. Our fax number is [phone].  If you have an urgent issue when the clinic is closed that cannot wait until the next business day, you can page your doctor at the number below.    Please note that while we do our best to be available for urgent issues outside of office hours, we are not available 24/7.   If you have an urgent issue and are unable to reach [place], you may choose to seek medical care at your doctor's office, retail clinic, urgent care center, or emergency room.  If you have a medical emergency, please immediately call 911 or go to the emergency department.  Pager Numbers  - Dr. Nehemiah Massed: [phone]  - Dr. Laurence Ferrari: [phone]  - Dr. Nicole Kindred: [phone]  In the event of inclement weather, please call our main line at [phone] for an update on the status of any delays or closures.  Dermatology Medication Tips: Please keep the boxes that topical medications come in in order to help keep track of the instructions about where and how to use these. Pharmacies typically print the medication instructions only on the boxes and not directly on the medication tubes.   If your medication is too expensive, please contact our office at [phone] option 4 or send [place] a message through [place].   We are unable to tell what your co-pay for medications will be in advance as this is different depending on your insurance coverage. However, we may be able to find a substitute  medication at lower cost or fill out paperwork to get insurance to cover a needed medication.   If a prior authorization is required to get your medication covered by your insurance company, please allow [place] 1-2 business days to complete this process.  Drug prices often vary depending on where the prescription is filled and some pharmacies may offer cheaper prices.  The website www.goodrx.com contains coupons for medications through different pharmacies. The prices here do not account for what the cost may be with help from insurance (it may be cheaper with your insurance), but the website can give you the price if you did not use any insurance.  - You can print the associated coupon and take it with your prescription to the pharmacy.  - You may also stop by our office during regular business hours and pick up a GoodRx coupon card.  - If you need your prescription sent electronically to a different pharmacy, notify our office through [facility] or by phone at [phone] option 4. Seborrheic Dermatitis  What is seborrheic dermatitis? Seborrheic (say: seb-oh-ree-ick) dermatitis is a disease that causes flaking of the skin.  It usually affects the scalp.  In teenagers and adults, it is commonly called "dandruff".  In infants, it is referred to as "cradle cap".  Dandruff often appears as scaling on the scalp with or without redness.  On other parts of the body, seborrheic dermatitis tends to produce both redness and scaling.  Other common locations of seborrheic dermatitis include the central face, eyebrows, chest, and the creases of the arms, legs, and groin.  It often causes the skin to look a little greasy, scaly, or flaky. Seborrheic dermatitis can occur at any age.  It often comes and goes and may to be seasonally related, especially in the Northern climates.  What causes seborrheic dermatitis? The exact cause is not known, though yeast of the Malassezia species may be involved.  This  organism is normally present on the skin in small numbers, but sometimes its numbers increase, especially in oily skin.  Treatments that reduce the yeast tend to improve seborrheic dermatitis.  How is seborrheic dermatitis treated? The treatment of seborrheic dermatitis depends on its location on the body and the person's age. Seborrheic dermatitis of the scalp (dandruff) in adults and teenagers is usually treated with a medicated shampoo.  Here is a list of the medications that help, and the over-counter shampoos that contain them:  Salicylic acid (Neutrogena T/Sal, Sebulex, Scalpicin, Denorex Extra Strength)  Zinc pyrithione (Head & Shoulders white bottle, Denorex Daily, DHS Zinc, Pantene Pro-V Pyrithione Zinc)  Selenium sulfide (Head & Shoulders blue bottle, Selsun Blue, Exsel Lotion Shampoo, Glo-Sel)  Yahoo tar (Neutrogena T/Gal, Pentrax, Zetar, Tegrin, Viacom, Therapeutic Denorex)  Ketoconazole (Nizoral)  If you have dandruff, you might start by using one of these shampoos every day until your dandruff is controlled and then keep using it at least twice a week.  Often times your doctor will recommend a rotation of several different medicated shampoos as some will experience a plateau in the effectiveness of any one shampoo.   When you use a dandruff shampoo, rub the shampoo into your wet hair and massage into scalp thoroughly.  Let it stay on your hair and scalp for 5 minutes before rinsing.  If you have involvement in the eyebrows or face, you can lather those areas with the medicated shampoo as well, or use a medicated soap (ZNP-bar, Polytar Soap, SAStid, or sulfur soap).    If the wash or shampoo alone does not help, your doctor might want you to use a prescription medication once or twice a day.  Leave-in medications for the scalp are best applied by massaging into the scalp immediately after towel drying your hair, but may be applied even if you have not washed your hair.  Seborrheic  dermatitis in infants usually clears up by age 44 -38 months.  It may develop in the diaper area where it might be confused with diaper rash.  For milder cases you can try gently brushing out scales with a soft brush.  This is best done immediately after washing with a non-medicated baby shampoo Wynetta Emery and Royce Macadamia, etc.).  Your doctor may recommend a medicated shampoo or a prescription topical medication.  Rosacea  What is rosacea? Rosacea (say: ro-zay-sha) is a common skin disease that usually begins as a trend of flushing or blushing easily.  As rosacea progresses, a persistent redness in the center of the face will develop and may gradually spread beyond the nose and cheeks to the forehead and chin.  In some cases, the ears, chest, and back could be affected.  Rosacea may appear as tiny blood vessels or small red bumps that occur in crops.  Frequently they can contain pus, and are called "pustules".  If the bumps do not contain pus, they are referred to as "papules".  Rarely, in prolonged, untreated cases of rosacea, the oil glands  of the nose and cheeks may become permanently enlarged.  This is called rhinophyma, and is seen more frequently in men.  Signs and Risks In its beginning stages, rosacea tends to come and go, which makes it difficult to recognize.  It can start as intermittent flushing of the face.  Eventually, blood vessels may become permanently visible.  Pustules and papules can appear, but can be mistaken for adult acne.  People of all races, ages, genders and ethnic groups are at risk of developing rosacea.  However, it is more common in women (especially around menopause) and adults with fair skin between the ages of 71 and 63.  Treatment Dermatologists typically recommend a combination of treatments to effectively manage rosacea.  Treatment can improve symptoms and may stop the progression of the rosacea.  Treatment may involve both topical and oral medications.  The  tetracycline antibiotics are often used for their anti-inflammatory effect; however, because of the possibility of developing antibiotic resistance, they should not be used long term at full dose.  For dilated blood vessels the options include electrodessication (uses electric current through a small needle), laser treatment, and cosmetics to hide the redness.   With all forms of treatment, improvement is a slow process, and patients may not see any results for the first 3-4 weeks.  It is very important to avoid the sun and other triggers.  Patients must wear sunscreen daily.  Skin Care Instructions: 1. Cleanse the skin with a mild soap such as CeraVe cleanser, Cetaphil cleanser, or Dove soap once or twice daily as needed. 2. Moisturize with Eucerin Redness Relief Daily Perfecting Lotion (has a subtle green tint), CeraVe Moisturizing Cream, or Oil of Olay Daily Moisturizer with sunscreen every morning and/or night as recommended. 3. Makeup should be "non-comedogenic" (won't clog pores) and be labeled "for sensitive skin". Good choices for cosmetics are: Neutrogena, Almay, and Physician's Formula.  Any product with a green tint tends to offset a red complexion. 4. If your eyes are dry and irritated, use artificial tears 2-3 times per day and cleanse the eyelids daily with baby shampoo.  Have your eyes examined at least every 2 years.  Be sure to tell your eye doctor that you have rosacea. 5. Alcoholic beverages tend to cause flushing of the skin, and may make rosacea worse. 6. Always wear sunscreen, protect your skin from extreme hot and cold temperatures, and avoid spicy foods, hot drinks, and mechanical irritation such as rubbing, scrubbing, or massaging the face.  Avoid harsh skin cleansers, cleansing masks, astringents, and exfoliation. If a particular product burns or makes your face feel tight, then it is likely to flare your rosacea. 7. If you are having difficulty finding a sunscreen that you can  tolerate, you may try switching to a chemical-free sunscreen.  These are ones whose active ingredient is zinc oxide or titanium dioxide only.  They should also be fragrance free, non-comedogenic, and labeled for sensitive skin. 8. Rosacea triggers may vary from person to person.  There are a variety of foods that have been reported to trigger rosacea.  Some patients find that keeping a diary of what they were doing when they flared helps them avoid triggers.

## 2020-08-05 ENCOUNTER — Encounter: Payer: Self-pay | Admitting: Dermatology

## 2020-11-29 ENCOUNTER — Ambulatory Visit: Payer: Medicare PPO | Admitting: Dermatology

## 2021-01-25 ENCOUNTER — Encounter: Payer: Self-pay | Admitting: General Surgery

## 2021-10-05 IMAGING — US US ABDOMINAL AORTA SCREENING AAA
1 series · 14 of 25 positions shown · non-contrast
Comparison: None.

CLINICAL DATA: Male between 65-75 years of age with a smoking
history.

EXAM:
US ABDOMINAL AORTA MEDICARE SCREENING
TECHNIQUE: Ultrasound examination of the abdominal aorta was performed as a
screening evaluation for abdominal aortic aneurysm.

[Series 1: us abdominal aorta screening aaa · 0.30mm/px · 14 of 27 slices shown]
[im 1/27]
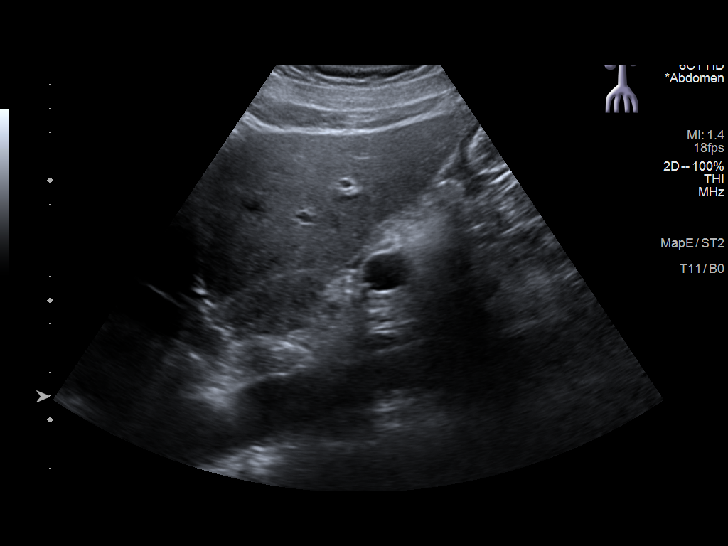
[im 3/27]
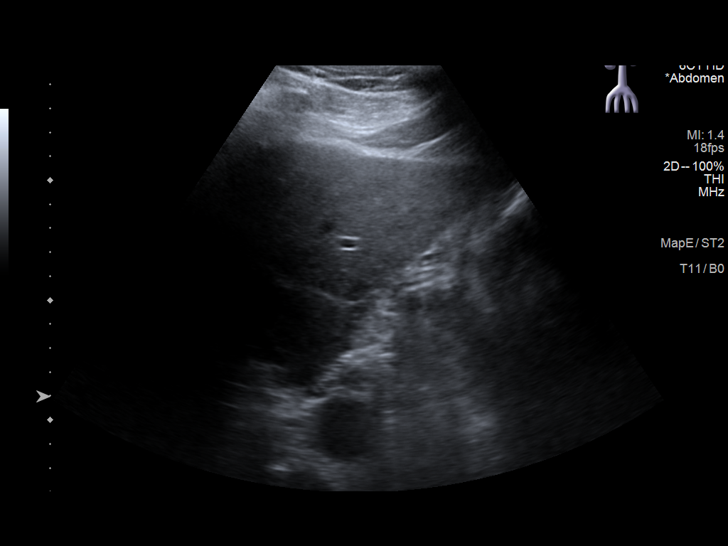
[im 5/27]
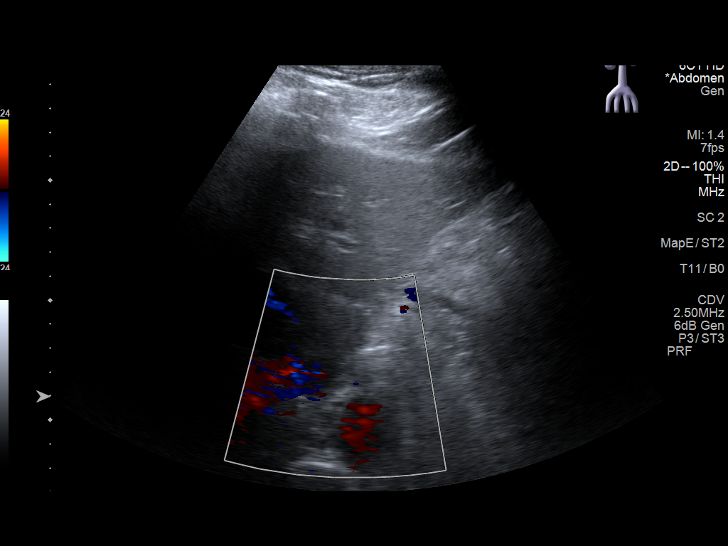
[im 7/27]
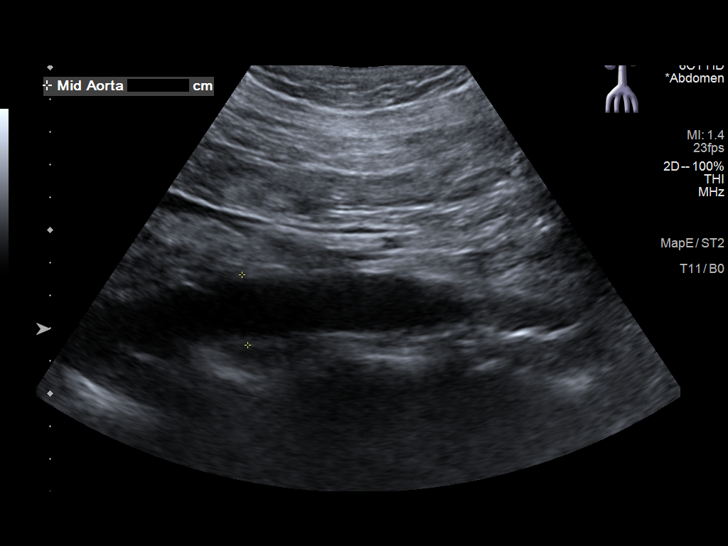
[im 9/27]
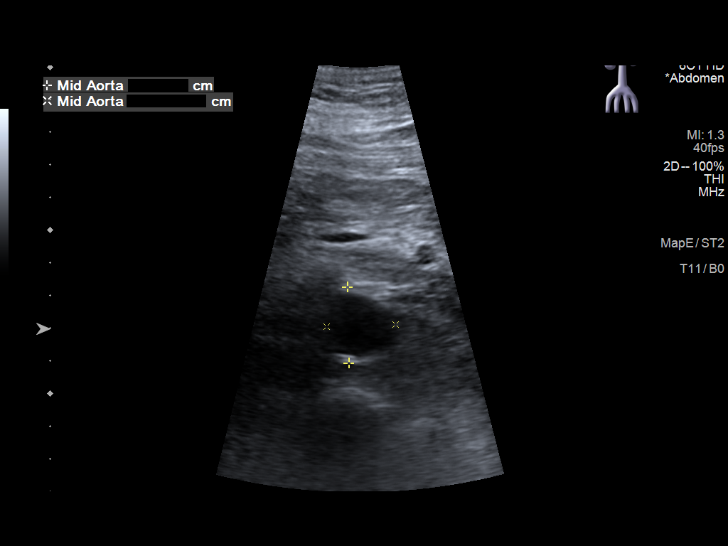
[im 10/27]
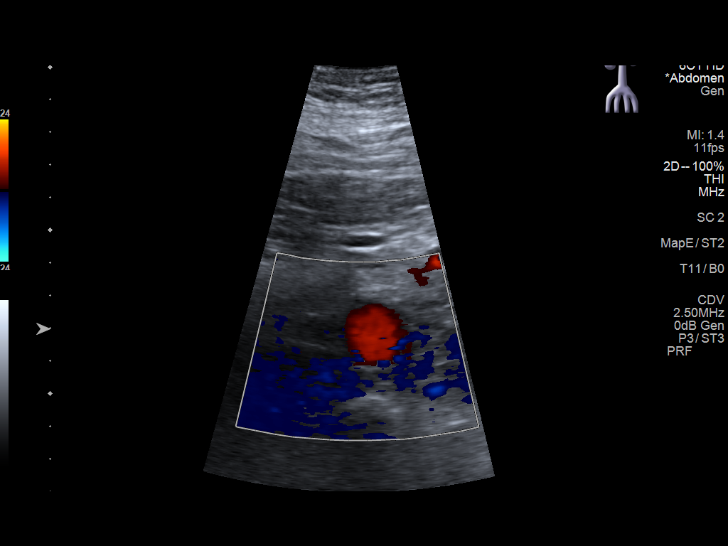
[im 12/27]
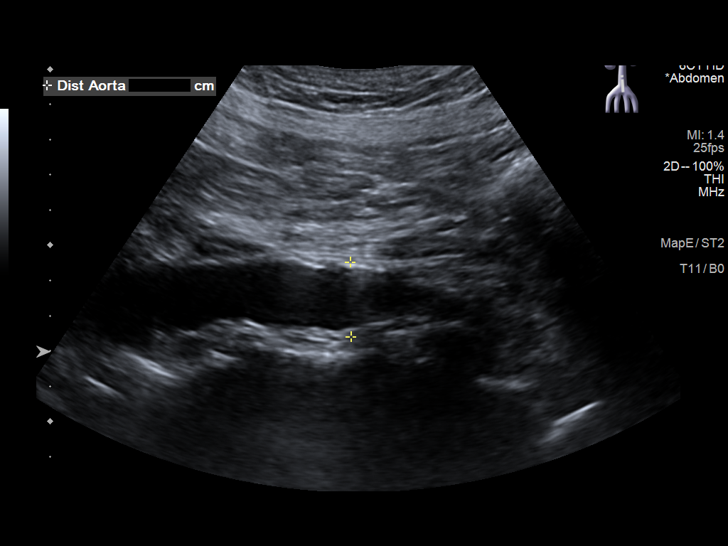
[im 15/27]
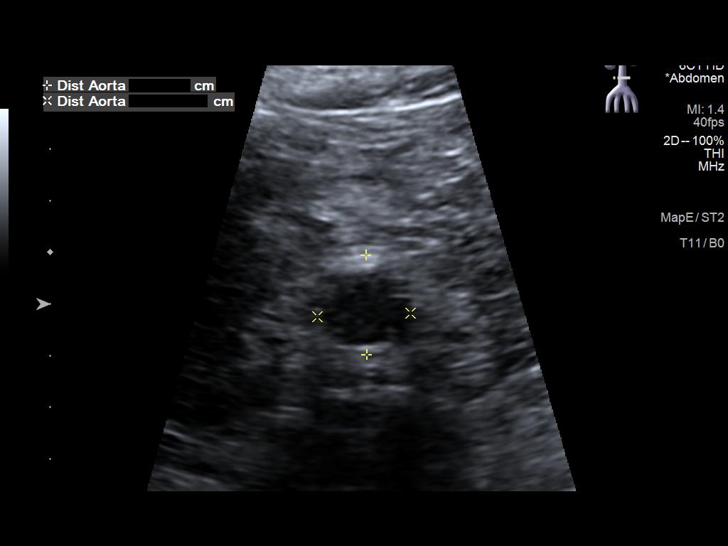
[im 17/27]
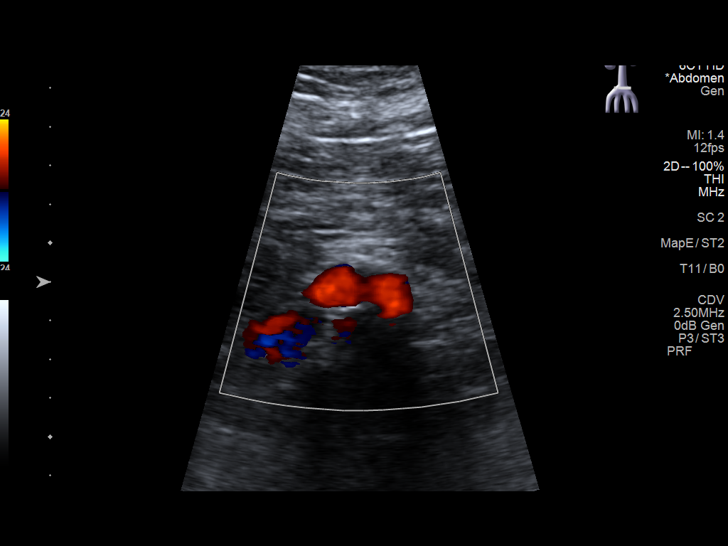
[im 18/27]
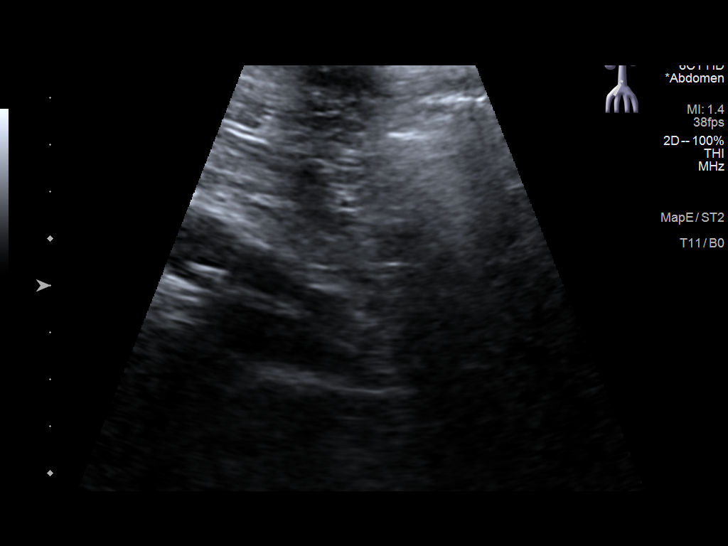
[im 20/27]
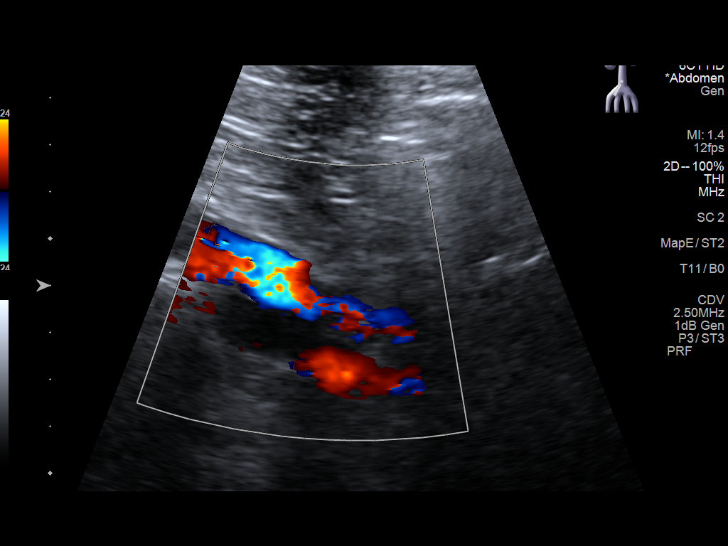
[im 22/27]
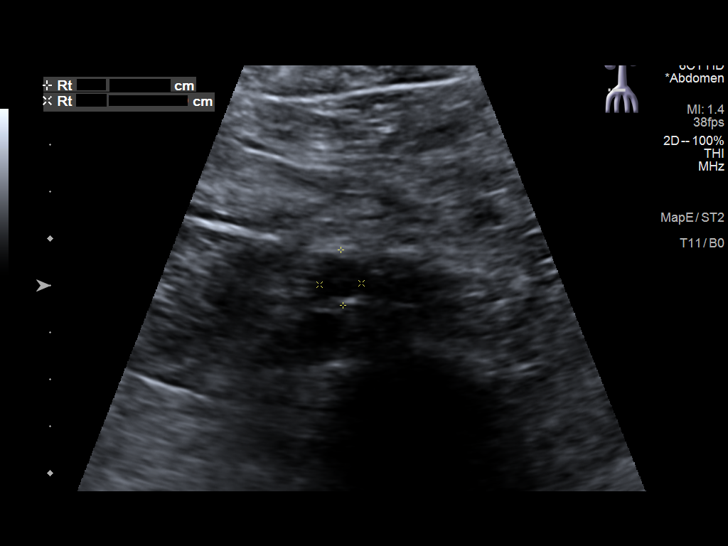
[im 24/27]
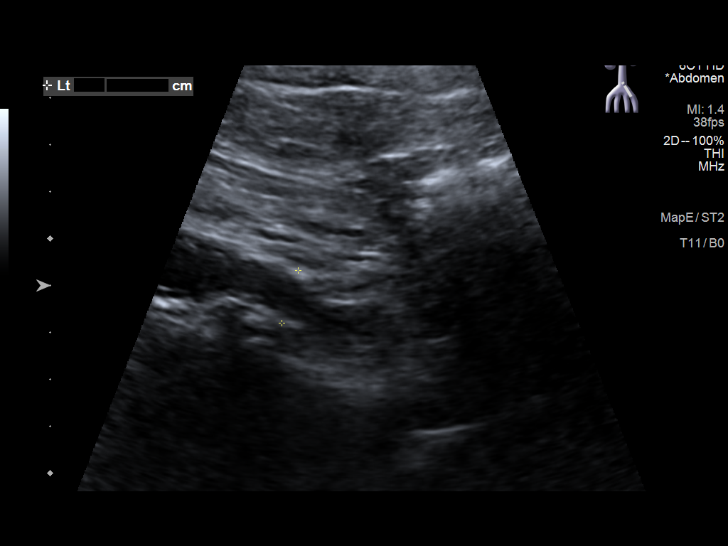
[im 27/27]
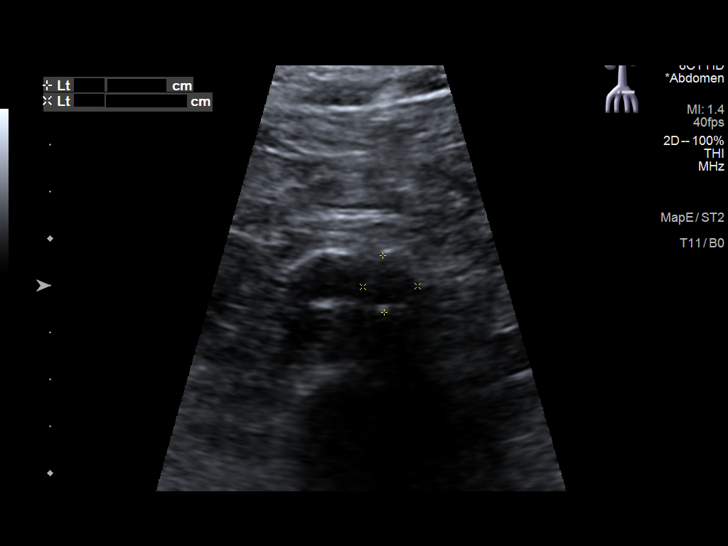

[14 of 25 positions shown; findings below may reference images not displayed]

FINDINGS: Abdominal aortic measurements as follows:

Proximal:  2.9 cm cm

Mid:  2.3 cm cm

Distal:  2.0 cm cm
IMPRESSION: No evidence of abdominal aortic aneurysm. No follow-up recommended.
This recommendation follows ACR consensus guidelines: White Paper of
the ACR Incidental Findings Committee II on Vascular Findings. [HOSPITAL] 4431; [DATE].

## 2021-11-10 ENCOUNTER — Ambulatory Visit: Payer: Medicare PPO | Admitting: Dermatology

## 2021-11-10 DIAGNOSIS — L732 Hidradenitis suppurativa: Secondary | ICD-10-CM

## 2021-11-10 MED ORDER — MUPIROCIN 2 % EX OINT: TOPICAL_OINTMENT | CUTANEOUS | 0 refills | Status: DC

## 2021-11-10 MED ORDER — CHLORHEXIDINE GLUCONATE 4 % EX LIQD: Freq: Every day | CUTANEOUS | 0 refills | Status: AC | PRN

## 2021-11-10 MED ORDER — DOXYCYCLINE HYCLATE 100 MG PO TABS: ORAL_TABLET | ORAL | 0 refills | Status: DC

## 2021-11-10 NOTE — Patient Instructions (Addendum)
Start Hibiclens wash  daily for 2 weeks then decrease to 3 days weeks, do not get Hibiclens near the eyes   Mupirocin ointment apply at bedtime inside each nostril daily  for 2 weeks   Start Doxycycline take 1 tablet twice a day for 1 week with food, then decrease to 1 tablet once a day for 1 week with food   Due to recent changes in healthcare laws, you may see results of your pathology and/or laboratory studies on MyChart before the doctors have had a chance to review them. We understand that in some cases there may be results that are confusing or concerning to you. Please understand that not all results are received at the same time and often the doctors may need to interpret multiple results in order to provide you with the best plan of care or course of treatment. Therefore, we ask that you please give [place] 2 business days to thoroughly review all your results before contacting the office for clarification. Should we see a critical lab result, you will be contacted sooner.   If You Need Anything After Your Visit  If you have any questions or concerns for your doctor, please call our main line at [phone] and press option 4 to reach your doctor's medical assistant. If no one answers, please leave a voicemail as directed and we will return your call as soon as possible. Messages left after 4 pm will be answered the following business day.   You may also send [place] a message via [place]. We typically respond to MyChart messages within 1-2 business days.  For prescription refills, please ask your pharmacy to contact our office. Our fax number is [phone].  If you have an urgent issue when the clinic is closed that cannot wait until the next business day, you can page your doctor at the number below.    Please note that while we do our best to be available for urgent issues outside of office hours, we are not available 24/7.   If you have an urgent issue and are unable to reach [place], you may  choose to seek medical care at your doctor's office, retail clinic, urgent care center, or emergency room.  If you have a medical emergency, please immediately call 911 or go to the emergency department.  Pager Numbers  - Dr. Nehemiah Massed: [phone]  - Dr. Laurence Ferrari: [phone]  - Dr. Nicole Kindred: [phone]  In the event of inclement weather, please call our main line at [phone] for an update on the status of any delays or closures.  Dermatology Medication Tips: Please keep the boxes that topical medications come in in order to help keep track of the instructions about where and how to use these. Pharmacies typically print the medication instructions only on the boxes and not directly on the medication tubes.   If your medication is too expensive, please contact our office at [phone] option 4 or send [place] a message through [place].   We are unable to tell what your co-pay for medications will be in advance as this is different depending on your insurance coverage. However, we may be able to find a substitute medication at lower cost or fill out paperwork to get insurance to cover a needed medication.   If a prior authorization is required to get your medication covered by your insurance company, please allow [place] 1-2 business days to complete this process.  Drug prices often vary depending on where the prescription is filled and some  pharmacies may offer cheaper prices.  The website www.goodrx.com contains coupons for medications through different pharmacies. The prices here do not account for what the cost may be with help from insurance (it may be cheaper with your insurance), but the website can give you the price if you did not use any insurance.  - You can print the associated coupon and take it with your prescription to the pharmacy.  - You may also stop by our office during regular business hours and pick up a GoodRx coupon card.  - If you need your prescription sent  electronically to a different pharmacy, notify our office through [facility] or by phone at [phone] option 4.     Si Usted Necesita Algo Despus de Su Visita  Tambin puede enviarnos un mensaje a travs de Pharmacist, community. Por lo general respondemos a los mensajes de MyChart en el transcurso de 1 a 2 das hbiles.  Para renovar recetas, por favor pida a su farmacia que se ponga en contacto con nuestra oficina. Harland Dingwall de fax es [place] [phone].  Si tiene un asunto urgente cuando la clnica est cerrada y que no puede esperar hasta el siguiente da hbil, puede llamar/localizar a su doctor(a) al nmero que aparece a continuacin.   Por favor, tenga en cuenta que aunque hacemos todo lo posible para estar disponibles para asuntos urgentes fuera del horario de [place], no estamos disponibles las 24 horas del da, los 7 das de la [place].   Si tiene un problema urgente y no puede comunicarse con nosotros, puede optar por buscar atencin mdica  en el consultorio de su doctor(a), en una clnica privada, en un centro de atencin urgente o en una sala de emergencias.  Si tiene Engineering geologist, por favor llame inmediatamente al 911 o vaya a la sala de emergencias.  Nmeros de bper  - Dr. Nehemiah Massed: [phone]  - Dra. Moye: [phone]  - Dra. Nicole Kindred: [phone]  En caso de inclemencias del [place], por favor llame a Johnsie Kindred principal al [phone] para una actualizacin sobre el [place] de cualquier retraso o cierre.  Consejos para la medicacin en dermatologa: Por favor, guarde las cajas en las que vienen los medicamentos de uso tpico para ayudarle a seguir las instrucciones sobre dnde y cmo usarlos. Las farmacias generalmente imprimen las instrucciones del medicamento slo en las cajas y no directamente en los tubos del [place].   Si su medicamento es muy caro, por favor, pngase en contacto con Zigmund Daniel llamando al [phone] y presione la  opcin 4 o envenos un mensaje a travs de Pharmacist, community.   No podemos decirle cul ser su copago por los medicamentos por adelantado ya que esto es diferente dependiendo de la cobertura de su seguro. Sin embargo, es posible que podamos encontrar un medicamento sustituto a Electrical engineer un formulario para que el seguro cubra el medicamento que se considera necesario.   Si se requiere una autorizacin previa para que su compaa de seguros [place] su medicamento, por favor permtanos de 1 a 2 das hbiles para completar este proceso.  Los precios de los medicamentos varan con frecuencia dependiendo del Environmental consultant de dnde se surte la receta y alguna farmacias pueden ofrecer precios ms baratos.  El sitio web www.goodrx.com tiene cupones para medicamentos de Airline pilot. Los precios aqu no tienen en cuenta lo que podra costar con la ayuda del seguro (puede ser ms barato con su seguro), pero el sitio web puede darle el precio si  utiliz ningn seguro.  - Puede imprimir el cupn correspondiente y llevarlo con su receta a la farmacia.  - Tambin puede pasar por nuestra oficina durante el horario de atencin regular y recoger una tarjeta de cupones de GoodRx.  - Si necesita que su receta se enve electrnicamente a una farmacia diferente, informe a nuestra oficina a travs de MyChart de [place] o por telfono llamando al [phone] y presione la opcin 4.  

## 2021-11-10 NOTE — Progress Notes (Signed)
   Follow-Up Visit   Subjective  Jeremy Atkinson is a 71 y.o. male who presents for the following: Skin Problem (Pt c/o boils in the buttock and right front leg x 7 weeks, pt tried short-term doxycycline and washing with Cetaphil with a poor response ).  The following portions of the chart were reviewed this encounter and updated as appropriate:   Tobacco  Allergies  Meds  Problems  Med Hx  Surg Hx  Fam Hx     Review of Systems:  No other skin or systemic complaints except as noted in HPI or Assessment and Plan.  Objective  Well appearing patient in no apparent distress; mood and affect are within normal limits.  A focused examination was performed including buttock right leg. Relevant physical exam findings are noted in the Assessment and Plan.  buttock Resolving papules, no active papules    Assessment & Plan  Hidradenitis suppurativa buttock  Hidradenitis Suppurativa is a chronic; persistent; non-curable, but treatable condition due to abnormal inflamed sweat glands in the body folds (axilla, inframammary, groin, medial thighs), causing recurrent painful draining cysts and scarring. It can be associated with severe scarring acne and cysts; also abscesses and scarring of scalp. The goal is control and prevention of flares, as it is not curable. Scars are permanent and can be thickened. Treatment may include daily use of topical medication and oral antibiotics.  Oral isotretinoin may also be helpful.  For more severe cases, Humira (a biologic injection) may be prescribed to decrease the inflammatory process and prevent flares.  When indicated, inflamed cysts may also be treated surgically.   Start Doxycycline take 1 tablet twice a day for 1 week with food, then decrease to 1 tablet once a day x 1 week with food   Doxycycline should be taken with food to prevent nausea. Do not lay down for 30 minutes after taking. Be cautious with sun exposure and use good sun protection while  on this medication. Pregnant women should not take this medication.    Start Hibiclens wash  daily for 2 weeks then decrease to 3 days weeks, do not get Hibiclens near the eyes   Mupirocin ointment apply at bedtime inside each nostril daily  for 2 weeks   Related Medications chlorhexidine (HIBICLENS) 4 % external liquid Apply topically daily as needed. apply daily for 2 weeks then decrease to 3 days weeks, do not get Hibiclens near the eyes doxycycline (VIBRA-TABS) 100 MG tablet Start Doxycycline take 1 tablet twice a day for 1 week with food, then decrease to 1 tablet once a day x 1 week with food mupirocin ointment (BACTROBAN) 2 % apply to each nostril at bedtime  x 2 weeks  Return in about 2 months (around 01/10/2022) for Hidradentitis .  IMarye Round, CMA, am acting as scribe for Sarina Ser, MD .  Documentation: I have reviewed the above documentation for accuracy and completeness, and I agree with the above.  Sarina Ser, MD

## 2021-11-12 ENCOUNTER — Encounter: Payer: Self-pay | Admitting: Dermatology

## 2022-01-11 ENCOUNTER — Ambulatory Visit: Payer: Medicare PPO | Admitting: Dermatology

## 2022-01-11 DIAGNOSIS — Z79899 Other long term (current) drug therapy: Secondary | ICD-10-CM | POA: Diagnosis not present

## 2022-01-11 DIAGNOSIS — L732 Hidradenitis suppurativa: Secondary | ICD-10-CM

## 2022-01-11 NOTE — Progress Notes (Signed)
   Follow-Up Visit   Subjective  Jeremy Atkinson is a 71 y.o. male who presents for the following: Hidradenitis (Buttock, 71mf/u, Did Doxycyline x 3 wks and d/c as per directions, Hibiclines wash 3x/wk, used Mupirocin oint qhs in nose x 2 wks, pt has not had any flares since last visit).  The following portions of the chart were reviewed this encounter and updated as appropriate:   Tobacco  Allergies  Meds  Problems  Med Hx  Surg Hx  Fam Hx     Review of Systems:  No other skin or systemic complaints except as noted in HPI or Assessment and Plan.  Objective  Well appearing patient in no apparent distress; mood and affect are within normal limits.  A focused examination was performed including buttock. Relevant physical exam findings are noted in the Assessment and Plan.  buttocks Clear today per patient    Assessment & Plan  Hidradenitis suppurativa buttocks  Hidradenitis Suppurativa is a chronic; persistent; non-curable, but treatable condition due to abnormal inflamed sweat glands in the body folds (axilla, inframammary, groin, medial thighs), causing recurrent painful draining cysts and scarring. It can be associated with severe scarring acne and cysts; also abscesses and scarring of scalp. The goal is control and prevention of flares, as it is not curable. Scars are permanent and can be thickened. Treatment may include daily use of topical medication and oral antibiotics.  Oral isotretinoin may also be helpful.  For more severe cases, Humira (a biologic injection) may be prescribed to decrease the inflammatory process and prevent flares.  When indicated, inflamed cysts may also be treated surgically.  Cont Hibiclens 3x/wk to wash buttocks, groin, axilla until runs out of bottle Restart Mupirocin oint hs to nostrils for 1 month May restart Doxycyline '100mg'$  1 po bid with food and drink for 2 weeks then decrease to 1 po qd prn flares  Doxycycline should be taken with food to  prevent nausea. Do not lay down for 30 minutes after taking. Be cautious with sun exposure and use good sun protection while on this medication. Pregnant women should not take this medication.    Related Medications chlorhexidine (HIBICLENS) 4 % external liquid Apply topically daily as needed. apply daily for 2 weeks then decrease to 3 days weeks, do not get Hibiclens near the eyes  doxycycline (VIBRA-TABS) 100 MG tablet Start Doxycycline take 1 tablet twice a day for 1 week with food, then decrease to 1 tablet once a day x 1 week with food  mupirocin ointment (BACTROBAN) 2 % apply to each nostril at bedtime  x 2 weeks   Return if symptoms worsen or fail to improve.  I, SOthelia Pulling RMA, am acting as scribe for DSarina Ser MD . Documentation: I have reviewed the above documentation for accuracy and completeness, and I agree with the above.  DSarina Ser MD

## 2022-01-11 NOTE — Patient Instructions (Addendum)
Continue Hibiclens 3 times a week to wash buttocks, groin, axilla until runs out of bottle, avoid face/eyes Restart Mupirocin ointment nightly to nostrils for 1 month If you flare, may restart Doxycyline '100mg'$  1 pill 2 times a day for 2 weeks, then 1 pill a day for 1 week, take with food and drink     Due to recent changes in healthcare laws, you may see results of your pathology and/or laboratory studies on MyChart before the doctors have had a chance to review them. We understand that in some cases there may be results that are confusing or concerning to you. Please understand that not all results are received at the same time and often the doctors may need to interpret multiple results in order to provide you with the best plan of care or course of treatment. Therefore, we ask that you please give [place] 2 business days to thoroughly review all your results before contacting the office for clarification. Should we see a critical lab result, you will be contacted sooner.   If You Need Anything After Your Visit  If you have any questions or concerns for your doctor, please call our main line at [phone] and press option 4 to reach your doctor's medical assistant. If no one answers, please leave a voicemail as directed and we will return your call as soon as possible. Messages left after 4 pm will be answered the following business day.   You may also send [place] a message via [place]. We typically respond to MyChart messages within 1-2 business days.  For prescription refills, please ask your pharmacy to contact our office. Our fax number is [phone].  If you have an urgent issue when the clinic is closed that cannot wait until the next business day, you can page your doctor at the number below.    Please note that while we do our best to be available for urgent issues outside of office hours, we are not available 24/7.   If you have an urgent issue and are unable to reach [place], you may choose  to seek medical care at your doctor's office, retail clinic, urgent care center, or emergency room.  If you have a medical emergency, please immediately call 911 or go to the emergency department.  Pager Numbers  - Dr. Nehemiah Massed: [phone]  - Dr. Laurence Ferrari: [phone]  - Dr. Nicole Kindred: [phone]  In the event of inclement weather, please call our main line at [phone] for an update on the status of any delays or closures.  Dermatology Medication Tips: Please keep the boxes that topical medications come in in order to help keep track of the instructions about where and how to use these. Pharmacies typically print the medication instructions only on the boxes and not directly on the medication tubes.   If your medication is too expensive, please contact our office at [phone] option 4 or send [place] a message through [place].   We are unable to tell what your co-pay for medications will be in advance as this is different depending on your insurance coverage. However, we may be able to find a substitute medication at lower cost or fill out paperwork to get insurance to cover a needed medication.   If a prior authorization is required to get your medication covered by your insurance company, please allow [place] 1-2 business days to complete this process.  Drug prices often vary depending on where the prescription is filled and some pharmacies may offer cheaper prices.  The website www.goodrx.com contains coupons for medications through different pharmacies. The prices here do not account for what the cost may be with help from insurance (it may be cheaper with your insurance), but the website can give you the price if you did not use any insurance.  - You can print the associated coupon and take it with your prescription to the pharmacy.  - You may also stop by our office during regular business hours and pick up a GoodRx coupon card.  - If you need your prescription sent electronically to a  different pharmacy, notify our office through [facility] or by phone at [phone] option 4.     Si Usted Necesita Algo Despus de Su Visita  Tambin puede enviarnos un mensaje a travs de Pharmacist, community. Por lo general respondemos a los mensajes de MyChart en el transcurso de 1 a 2 das hbiles.  Para renovar recetas, por favor pida a su farmacia que se ponga en contacto con nuestra oficina. Harland Dingwall de fax es [place] [phone].  Si tiene un asunto urgente cuando la clnica est cerrada y que no puede esperar hasta el siguiente da hbil, puede llamar/localizar a su doctor(a) al nmero que aparece a continuacin.   Por favor, tenga en cuenta que aunque hacemos todo lo posible para estar disponibles para asuntos urgentes fuera del horario de [place], no estamos disponibles las 24 horas del da, los 7 das de la [place].   Si tiene un problema urgente y no puede comunicarse con nosotros, puede optar por buscar atencin mdica  en el consultorio de su doctor(a), en una clnica privada, en un centro de atencin urgente o en una sala de emergencias.  Si tiene Engineering geologist, por favor llame inmediatamente al 911 o vaya a la sala de emergencias.  Nmeros de bper  - Dr. Nehemiah Massed: [phone]  - Dra. Moye: [phone]  - Dra. Nicole Kindred: [phone]  En caso de inclemencias del [place], por favor llame a Johnsie Kindred principal al [phone] para una actualizacin sobre el [place] de cualquier retraso o cierre.  Consejos para la medicacin en dermatologa: Por favor, guarde las cajas en las que vienen los medicamentos de uso tpico para ayudarle a seguir las instrucciones sobre dnde y cmo usarlos. Las farmacias generalmente imprimen las instrucciones del medicamento slo en las cajas y no directamente en los tubos del [place].   Si su medicamento es muy caro, por favor, pngase en contacto con Zigmund Daniel llamando al [phone] y presione la opcin 4 o envenos un  mensaje a travs de Pharmacist, community.   No podemos decirle cul ser su copago por los medicamentos por adelantado ya que esto es diferente dependiendo de la cobertura de su seguro. Sin embargo, es posible que podamos encontrar un medicamento sustituto a Electrical engineer un formulario para que el seguro cubra el medicamento que se considera necesario.   Si se requiere una autorizacin previa para que su compaa de seguros [place] su medicamento, por favor permtanos de 1 a 2 das hbiles para completar este proceso.  Los precios de los medicamentos varan con frecuencia dependiendo del Environmental consultant de dnde se surte la receta y alguna farmacias pueden ofrecer precios ms baratos.  El sitio web www.goodrx.com tiene cupones para medicamentos de Airline pilot. Los precios aqu no tienen en cuenta lo que podra costar con la ayuda del seguro (puede ser ms barato con su seguro), pero el sitio web puede darle el precio si no utiliz Research scientist (physical sciences).  -  Puede imprimir el cupn correspondiente y llevarlo con su receta a la farmacia.  - Tambin puede pasar por nuestra oficina durante el horario de atencin regular y Charity fundraiser una tarjeta de cupones de GoodRx.  - Si necesita que su receta se enve electrnicamente a una farmacia diferente, informe a nuestra oficina a travs de MyChart de [place] o por telfono llamando al [phone] y presione la opcin 4.

## 2022-01-16 ENCOUNTER — Encounter: Payer: Self-pay | Admitting: Dermatology

## 2022-07-26 ENCOUNTER — Other Ambulatory Visit: Payer: Self-pay | Admitting: Dermatology

## 2022-07-26 DIAGNOSIS — L732 Hidradenitis suppurativa: Secondary | ICD-10-CM

## 2023-05-21 ENCOUNTER — Emergency Department
Admission: EM | Admit: 2023-05-21 | Discharge: 2023-05-21 | Disposition: A | Discharge status: Home / Self Care | Payer: Medicare PPO | Attending: Emergency Medicine | Admitting: Emergency Medicine

## 2023-05-21 DIAGNOSIS — I10 Essential (primary) hypertension: Secondary | ICD-10-CM | POA: Insufficient documentation

## 2023-05-21 DIAGNOSIS — K529 Noninfective gastroenteritis and colitis, unspecified: Secondary | ICD-10-CM | POA: Diagnosis not present

## 2023-05-21 DIAGNOSIS — R112 Nausea with vomiting, unspecified: Secondary | ICD-10-CM | POA: Diagnosis present

## 2023-05-21 LAB — COMPREHENSIVE METABOLIC PANEL
ALT: 25 U/L (ref 0–44)
AST: 28 U/L (ref 15–41)
Albumin: 4.3 g/dL (ref 3.5–5.0)
Alkaline Phosphatase: 79 U/L (ref 38–126)
Anion gap: 10 (ref 5–15)
BUN: 60 mg/dL — ABNORMAL HIGH (ref 8–23)
CO2: 21 mmol/L — ABNORMAL LOW (ref 22–32)
Calcium: 9.7 mg/dL (ref 8.9–10.3)
Chloride: 104 mmol/L (ref 98–111)
Creatinine, Ser: 2.05 mg/dL — ABNORMAL HIGH (ref 0.61–1.24)
GFR, Estimated: 33 mL/min — ABNORMAL LOW (ref >60)
Glucose, Bld: 86 mg/dL (ref 70–99)
Potassium: 4.7 mmol/L (ref 3.5–5.1)
Sodium: 135 mmol/L (ref 135–145)
Total Bilirubin: 2.3 mg/dL — ABNORMAL HIGH (ref 0.0–1.2)
Total Protein: 8.1 g/dL (ref 6.5–8.1)

## 2023-05-21 LAB — CBC
HCT: 47.4 % (ref 39.0–52.0)
Hemoglobin: 16.8 g/dL (ref 13.0–17.0)
MCH: 34.1 pg — ABNORMAL HIGH (ref 26.0–34.0)
MCHC: 35.4 g/dL (ref 30.0–36.0)
MCV: 96.1 fL (ref 80.0–100.0)
Platelets: 210 10*3/uL (ref 150–400)
RBC: 4.93 MIL/uL (ref 4.22–5.81)
RDW: 12.7 % (ref 11.5–15.5)
WBC: 9.6 10*3/uL (ref 4.0–10.5)
nRBC: 0 % (ref 0.0–0.2)

## 2023-05-21 LAB — LIPASE, BLOOD: Lipase: 191 U/L — ABNORMAL HIGH (ref 11–51)

## 2023-05-21 MED ORDER — ONDANSETRON 4 MG PO TBDP: 4.0000 mg | ORAL_TABLET | Freq: Three times a day (TID) | ORAL | 0 refills | Status: AC | PRN

## 2023-05-21 MED ORDER — ONDANSETRON HCL 4 MG/2ML IJ SOLN
4.0000 mg | Freq: Once | INTRAMUSCULAR | Status: AC
  Administered 2023-05-21: 4 mg via INTRAVENOUS
  Filled 2023-05-21: qty 2

## 2023-05-21 MED ORDER — SODIUM CHLORIDE 0.9 % IV SOLN: Freq: Once | INTRAVENOUS | Status: AC

## 2023-05-21 MED ORDER — SODIUM CHLORIDE 0.9 % IV BOLUS
500.0000 mL | Freq: Once | INTRAVENOUS | Status: AC
  Administered 2023-05-21: 500 mL via INTRAVENOUS

## 2023-05-21 MED ORDER — ONDANSETRON 4 MG PO TBDP
4.0000 mg | ORAL_TABLET | Freq: Once | ORAL | Status: AC
  Administered 2023-05-21: 4 mg via ORAL
  Filled 2023-05-21: qty 1

## 2023-05-21 NOTE — ED Provider Notes (Signed)
 [facility] Provider Note    Event Date/Time   First MD Initiated Contact with Patient 05/21/23 [id number]     (approximate)   History   Emesis   HPI  Jeremy Atkinson is a 73 y.o. male with history of hypertension, gout, thyroid  disease who presents with complaints of nausea vomiting and diarrhea for 5 to 6 days.  Was seen in clinic next-door and referred here for borderline blood pressure.  Patient denies abdominal pain.  Complains primarily of nausea vomiting and diarrhea.  He reports he did take his blood pressure medication this morning     Physical Exam   Triage Vital Signs: ED Triage Vitals [05/21/23 0902]  Encounter Vitals Group     BP 98/66     Systolic BP Percentile      Diastolic BP Percentile      Pulse Rate 92     Resp 20     Temp 98.1 F (36.7 C)     Temp src      SpO2 98 %     Weight      Height      Head Circumference      Peak Flow      Pain Score      Pain Loc      Pain Education      Exclude from Growth Chart     Most recent vital signs: Vitals:   05/21/23 0902 05/21/23 0920  BP: 98/66 96/66  Pulse: 92 83  Resp: 20 20  Temp: 98.1 F (36.7 C)   SpO2: 98% 97%     General: Awake, no distress.  CV:  Good peripheral perfusion.  Resp:  Normal effort.  Abd:  No distention.  Soft, nontender, reassuring exam Other:     ED Results / Procedures / Treatments   Labs (all labs ordered are listed, but only abnormal results are displayed) Labs Reviewed  LIPASE, BLOOD - Abnormal; Notable for the following components:      Result Value   Lipase 191 (*)    All other components within normal limits  COMPREHENSIVE METABOLIC PANEL - Abnormal; Notable for the following components:   CO2 21 (*)    BUN 60 (*)    Creatinine, Ser 2.05 (*)    Total Bilirubin 2.3 (*)    GFR, Estimated 33 (*)    All other components within normal limits  CBC - Abnormal; Notable for the following components:   MCH 34.1 (*)    All other  components within normal limits  URINALYSIS, ROUTINE W REFLEX MICROSCOPIC     EKG     RADIOLOGY     PROCEDURES:  Critical Care performed:   Procedures   MEDICATIONS ORDERED IN ED: Medications  ondansetron  (ZOFRAN -ODT) disintegrating tablet 4 mg (4 mg Oral Given 05/21/23 0909)  0.9 %  sodium chloride  infusion (0 mLs Intravenous Stopped 05/21/23 1104)  ondansetron  (ZOFRAN ) injection 4 mg (4 mg Intravenous Given 05/21/23 0933)  sodium chloride  0.9 % bolus 500 mL (500 mLs Intravenous New Bag/Given 05/21/23 1103)     IMPRESSION / MDM / ASSESSMENT AND PLAN / ED COURSE  I reviewed the triage vital signs and the nursing notes. Patient's presentation is most consistent with acute presentation with potential threat to life or bodily function.  Patient presents with nausea vomiting diarrhea as detailed above, hypotensive clinic next-door.  Blood pressure here is soft but normotensive.  Suspect dehydration versus acute kidney injury from likely viral gastroenteritis which  is prevalent in community this time.  Will treat with IV fluids, IV Zofran , labs pending  ----------------------------------------- 11:51 AM on 05/21/2023 ----------------------------------------- Patient feeling much improved on reevaluation, his blood pressure is 107/80, he he would like to go home I think this is reasonable, recommend outpatient follow-up with PCP for recheck chemistries to ensure creatinine normalizes, his baseline appears to be 1.5, 1.6        FINAL CLINICAL IMPRESSION(S) / ED DIAGNOSES   Final diagnoses:  Gastroenteritis     Rx / DC Orders   ED Discharge Orders          Ordered    ondansetron  (ZOFRAN -ODT) 4 MG disintegrating tablet  Every 8 hours PRN        05/21/23 1151             Note:  This document was prepared using Dragon voice recognition software and may include unintentional dictation errors.   Bryson Carbine, MD 05/21/23 [id number]

## 2023-05-21 NOTE — Discharge Instructions (Addendum)
 Please follow-up with your PCP in about a week to recheck your blood tests to ensure that your dehydration is improved

## 2023-05-21 NOTE — ED Notes (Signed)
 See triage notes. Patient c/o N/V/D times six days. Denies abdominal pain, shortness of breath and chest pain. Patient has had increased fatigue. Patient was hypotensive at [facility] this morning.

## 2023-05-21 NOTE — ED Triage Notes (Addendum)
 Pt to ED via POV from [facility] Pt reports N/V/D x6 days. Pt denies abd pain, CP or SOB. Pt reports increased fatigue. Pt hypotensive at [facility] in the 80s systolic. Pt reports did take BP meds this am (lisinopril ).

## 2023-05-21 NOTE — ED Triage Notes (Signed)
 FIRST NURSE NOTE:  Pt arrived via w/c  from [facility] for several days, reports fatigue and weakness.  86/50 P 109

## 2024-04-09 ENCOUNTER — Other Ambulatory Visit: Payer: Self-pay | Admitting: Podiatry

## 2024-04-09 DIAGNOSIS — M19072 Primary osteoarthritis, left ankle and foot: Secondary | ICD-10-CM

## 2024-04-11 ENCOUNTER — Encounter: Payer: Self-pay | Admitting: Podiatry

## 2024-04-22 ENCOUNTER — Inpatient Hospital Stay
Admission: RE | Admit: 2024-04-22 | Discharge: 2024-04-22 | Disposition: A | Source: Ambulatory Visit | Attending: Podiatry | Admitting: Podiatry

## 2024-04-22 DIAGNOSIS — M19072 Primary osteoarthritis, left ankle and foot: Secondary | ICD-10-CM

## 2024-04-22 MED ORDER — GADOPICLENOL 0.5 MMOL/ML IV SOLN
10.0000 mL | Freq: Once | INTRAVENOUS | Status: AC | PRN
  Administered 2024-04-22: 10 mL via INTRAVENOUS
